# Patient Record
Sex: Female | Born: 1950 | Race: White | Hispanic: No | Marital: Married | Smoking: Never smoker
Health system: Southern US, Community
[De-identification: ages and names within clinical notes are randomized; demographics above are authoritative.]

## PROBLEM LIST (undated history)

## (undated) DIAGNOSIS — I4719 Other supraventricular tachycardia: Secondary | ICD-10-CM

## (undated) DIAGNOSIS — M797 Fibromyalgia: Secondary | ICD-10-CM

## (undated) DIAGNOSIS — I493 Ventricular premature depolarization: Secondary | ICD-10-CM

## (undated) DIAGNOSIS — B54 Unspecified malaria: Secondary | ICD-10-CM

## (undated) DIAGNOSIS — I4510 Unspecified right bundle-branch block: Secondary | ICD-10-CM

## (undated) DIAGNOSIS — I491 Atrial premature depolarization: Secondary | ICD-10-CM

## (undated) DIAGNOSIS — I471 Supraventricular tachycardia: Secondary | ICD-10-CM

## (undated) DIAGNOSIS — I700 Atherosclerosis of aorta: Secondary | ICD-10-CM

## (undated) DIAGNOSIS — T7840XA Allergy, unspecified, initial encounter: Secondary | ICD-10-CM

## (undated) DIAGNOSIS — G4733 Obstructive sleep apnea (adult) (pediatric): Secondary | ICD-10-CM

## (undated) DIAGNOSIS — J449 Chronic obstructive pulmonary disease, unspecified: Secondary | ICD-10-CM

## (undated) DIAGNOSIS — J479 Bronchiectasis, uncomplicated: Secondary | ICD-10-CM

## (undated) DIAGNOSIS — M810 Age-related osteoporosis without current pathological fracture: Secondary | ICD-10-CM

## (undated) DIAGNOSIS — Z9289 Personal history of other medical treatment: Secondary | ICD-10-CM

## (undated) HISTORY — DX: Unspecified right bundle-branch block: I45.10

## (undated) HISTORY — DX: Personal history of other medical treatment: Z92.89

## (undated) HISTORY — DX: Chronic obstructive pulmonary disease, unspecified: J44.9

## (undated) HISTORY — DX: Ventricular premature depolarization: I49.3

## (undated) HISTORY — DX: Bronchiectasis, uncomplicated: J47.9

## (undated) HISTORY — DX: Atrial premature depolarization: I49.1

## (undated) HISTORY — DX: Allergy, unspecified, initial encounter: T78.40XA

## (undated) HISTORY — DX: Supraventricular tachycardia: I47.1

## (undated) HISTORY — PX: APPENDECTOMY: SHX54

## (undated) HISTORY — DX: Other supraventricular tachycardia: I47.19

## (undated) HISTORY — DX: Age-related osteoporosis without current pathological fracture: M81.0

## (undated) HISTORY — DX: Atherosclerosis of aorta: I70.0

## (undated) HISTORY — DX: Unspecified malaria: B54

## (undated) HISTORY — DX: Obstructive sleep apnea (adult) (pediatric): G47.33

## (undated) HISTORY — DX: Fibromyalgia: M79.7

## (undated) HISTORY — PX: TEMPOROMANDIBULAR JOINT SURGERY: SHX35

---

## 1998-10-21 ENCOUNTER — Encounter: Admission: RE | Admit: 1998-10-21 | Discharge: 1998-10-21 | Payer: Self-pay | Admitting: Internal Medicine

## 1998-12-03 ENCOUNTER — Other Ambulatory Visit: Admission: RE | Admit: 1998-12-03 | Discharge: 1998-12-03 | Payer: Self-pay | Admitting: Obstetrics & Gynecology

## 1999-09-05 ENCOUNTER — Emergency Department (HOSPITAL_COMMUNITY): Admission: EM | Admit: 1999-09-05 | Discharge: 1999-09-05 | Payer: Self-pay | Admitting: Emergency Medicine

## 1999-12-09 ENCOUNTER — Other Ambulatory Visit: Admission: RE | Admit: 1999-12-09 | Discharge: 1999-12-09 | Payer: Self-pay | Admitting: Obstetrics & Gynecology

## 2000-12-15 ENCOUNTER — Other Ambulatory Visit: Admission: RE | Admit: 2000-12-15 | Discharge: 2000-12-15 | Payer: Self-pay | Admitting: Obstetrics & Gynecology

## 2001-03-16 ENCOUNTER — Other Ambulatory Visit: Admission: RE | Admit: 2001-03-16 | Discharge: 2001-03-16 | Payer: Self-pay | Admitting: Obstetrics & Gynecology

## 2001-12-21 ENCOUNTER — Other Ambulatory Visit: Admission: RE | Admit: 2001-12-21 | Discharge: 2001-12-21 | Payer: Self-pay | Admitting: Obstetrics & Gynecology

## 2003-01-30 ENCOUNTER — Other Ambulatory Visit: Admission: RE | Admit: 2003-01-30 | Discharge: 2003-01-30 | Payer: Self-pay | Admitting: Obstetrics & Gynecology

## 2004-02-05 ENCOUNTER — Other Ambulatory Visit: Admission: RE | Admit: 2004-02-05 | Discharge: 2004-02-05 | Payer: Self-pay | Admitting: Obstetrics & Gynecology

## 2005-02-06 ENCOUNTER — Other Ambulatory Visit: Admission: RE | Admit: 2005-02-06 | Discharge: 2005-02-06 | Payer: Self-pay | Admitting: Obstetrics & Gynecology

## 2006-02-17 ENCOUNTER — Other Ambulatory Visit: Admission: RE | Admit: 2006-02-17 | Discharge: 2006-02-17 | Payer: Self-pay | Admitting: Obstetrics & Gynecology

## 2006-08-16 ENCOUNTER — Ambulatory Visit: Payer: Self-pay | Admitting: Gastroenterology

## 2009-09-28 ENCOUNTER — Encounter: Admission: RE | Admit: 2009-09-28 | Discharge: 2009-09-28 | Payer: Self-pay | Admitting: Neurosurgery

## 2010-12-21 ENCOUNTER — Encounter: Payer: Self-pay | Admitting: Otolaryngology

## 2013-01-10 ENCOUNTER — Other Ambulatory Visit: Payer: Self-pay | Admitting: Family Medicine

## 2013-01-10 DIAGNOSIS — E049 Nontoxic goiter, unspecified: Secondary | ICD-10-CM

## 2013-01-17 ENCOUNTER — Ambulatory Visit (INDEPENDENT_AMBULATORY_CARE_PROVIDER_SITE_OTHER): Payer: BC Managed Care – PPO | Admitting: Family Medicine

## 2013-01-17 DIAGNOSIS — H6590 Unspecified nonsuppurative otitis media, unspecified ear: Secondary | ICD-10-CM

## 2013-01-17 DIAGNOSIS — R42 Dizziness and giddiness: Secondary | ICD-10-CM

## 2013-01-17 DIAGNOSIS — R002 Palpitations: Secondary | ICD-10-CM

## 2013-01-17 DIAGNOSIS — J0100 Acute maxillary sinusitis, unspecified: Secondary | ICD-10-CM

## 2013-01-17 MED ORDER — AZITHROMYCIN 250 MG PO TABS: ORAL_TABLET | ORAL | Status: DC

## 2013-01-17 MED ORDER — IPRATROPIUM BROMIDE 0.06 % NA SOLN: 2.0000 | Freq: Four times a day (QID) | NASAL | Status: DC

## 2013-01-17 NOTE — Progress Notes (Signed)
Subjective:    Patient ID: Michele Jones, female    DOB: 1951/02/03, 62 y.o.   MRN: [id number]  HPI Michele Jones is a 62 y.o. female  Fatigue over [past week with slight runny nose, slight phlegm in throat.  Last few days - sinus headache, fatigue, dizzy at times with quick movements - has noticed this coinciding with R ear pain/pressure. Low grade ha with pressure under L greater than R eye.  Popping/clicking in ear. Runny nose at times.  Tx: allegra - not sure if has pseudoephedrine in it.  in am. Humidifier, nasal spray from health food store - unknown ingredient.   Zpak in past for sinus if  Review of Systems  Constitutional: Positive for fatigue. Negative for fever and chills.  HENT: Positive for ear pain (clicking/popping. ), congestion and sinus pressure.   Respiratory: Positive for wheezing. Negative for chest tightness and shortness of breath.   Cardiovascular: Positive for palpitations (few extra beats every now and then - past 2 weeks. ). Negative for chest pain.       Objective:   Physical Exam  Vitals reviewed. Constitutional: She is oriented to person, place, and time. She appears well-developed and well-nourished. No distress.  HENT:  Head: Normocephalic and atraumatic.  Right Ear: Hearing, external ear and ear canal normal. A middle ear effusion (clear - yellow fluid behind L tm. no erythema.) is present.  Left Ear: Hearing, tympanic membrane, external ear and ear canal normal.  Nose: No mucosal edema. Right sinus exhibits maxillary sinus tenderness. Left sinus exhibits maxillary sinus tenderness.  Mouth/Throat: Oropharynx is clear and moist. No oropharyngeal exudate.  Eyes: Conjunctivae and EOM are normal. Pupils are equal, round, and reactive to light.  Cardiovascular: Normal rate, regular rhythm, normal heart sounds and intact distal pulses.   No murmur heard. Pulmonary/Chest: Effort normal and breath sounds normal. No respiratory distress. She has no  wheezes. She has no rhonchi.  Neurological: She is alert and oriented to person, place, and time. She has normal strength. No sensory deficit. She displays a negative Romberg sign. Coordination and gait normal.  No nystagmus, nonfocal exam.   Skin: Skin is warm and dry. No rash noted.  Psychiatric: She has a normal mood and affect. Her behavior is normal.       Assessment & Plan:  Michele Jones is a 61 y.o. female Serous otitis media, right with sinusitis/sinus pressure - ddx viral vs early infectious with  Secondary fatigue, HA.. May also be contributing to dizziness.  Plan: azithromycin (ZITHROMAX) 250 MG tablet, ipratropium (ATROVENT) 0.06 % nasal spray.  Fluids, sx care as below.   Sinusitis, acute, maxillary - Plan: azithromycin (ZITHROMAX) 250 MG tablet, saline ns, rtc precautions.  Palpitations - ? Med related. Advised against pseudoephedrine products, and if sx's persist - rtc.  Patient Instructions  Your ear pressure and clicking, and dizziness with head movement are likely from a serous otitis - see below. Your sinus symptoms could be from congestion or early infection.  You can start the prescribed nasal spray, z pak antibiotic, and Saline nasal spray atleast 4 times per day.  drink plenty of fluids.  Continue allegra, but make sure it is not combined with a decongestant (pseudoephedrine) as this may cause heart palpitiations.  If the palpitations are not improving in the next few days, or these or the dizziness worsens - Return to the clinic or go to the nearest emergency room.  Serous Otitis Media  Serous otitis media is also known  as otitis media with effusion (OME). It means there is fluid in the middle ear space. This space contains the bones for hearing and air. Air in the middle ear space helps to transmit sound.  The air gets there through the eustachian tube. This tube goes from the back of the throat to the middle ear space. It keeps the pressure in the middle ear the  same as the outside world. It also helps to drain fluid from the middle ear space. CAUSES  OME occurs when the eustachian tube gets blocked. Blockage can come from:  Ear infections.  Colds and other upper respiratory infections.  Allergies.  Irritants such as cigarette smoke.  Sudden changes in air pressure (such as descending in an airplane).  Enlarged adenoids. During colds and upper respiratory infections, the middle ear space can become temporarily filled with fluid. This can happen after an ear infection also. Once the infection clears, the fluid will generally drain out of the ear through the eustachian tube. If it does not, then OME occurs. SYMPTOMS   Hearing loss.  A feeling of fullness in the ear  but no pain.  Young children may not show any symptoms. DIAGNOSIS   Diagnosis of OME is made by an ear exam.  Tests may be done to check on the movement of the eardrum.  Hearing exams may be done. TREATMENT   The fluid most often goes away without treatment.  If allergy is the cause, allergy treatment may be helpful.  Fluid that persists for several months may require minor surgery. A small tube is placed in the ear drum to:  Drain the fluid.  Restore the air in the middle ear space.  In certain situations, antibiotics are used to avoid surgery.  Surgery may be done to remove enlarged adenoids (if this is the cause). HOME CARE INSTRUCTIONS   Keep children away from tobacco smoke.  Be sure to keep follow up appointments, if any. SEEK MEDICAL CARE IF:   Hearing is not better in 3 months.  Hearing is worse.  Ear pain.  Drainage from the ear.  Dizziness. Document Released: 02/06/2004 Document Revised: 02/08/2012 Document Reviewed: 12/06/2008 [facility] Patient Information 2013 [place], [place].

## 2013-01-17 NOTE — Patient Instructions (Signed)
Your ear pressure and clicking, and dizziness with head movement are likely from a serous otitis - see below. Your sinus symptoms could be from congestion or early infection.  You can start the prescribed nasal spray, z pak antibiotic, and Saline nasal spray atleast 4 times per day.  drink plenty of fluids.  Continue allegra, but make sure it is not combined with a decongestant (pseudoephedrine) as this may cause heart palpitiations.  If the palpitations are not improving in the next few days, or these or the dizziness worsens - Return to the clinic or go to the nearest emergency room.  Serous Otitis Media  Serous otitis media is also known as otitis media with effusion (OME). It means there is fluid in the middle ear space. This space contains the bones for hearing and air. Air in the middle ear space helps to transmit sound.  The air gets there through the eustachian tube. This tube goes from the back of the throat to the middle ear space. It keeps the pressure in the middle ear the same as the outside world. It also helps to drain fluid from the middle ear space. CAUSES  OME occurs when the eustachian tube gets blocked. Blockage can come from:  Ear infections.  Colds and other upper respiratory infections.  Allergies.  Irritants such as cigarette smoke.  Sudden changes in air pressure (such as descending in an airplane).  Enlarged adenoids. During colds and upper respiratory infections, the middle ear space can become temporarily filled with fluid. This can happen after an ear infection also. Once the infection clears, the fluid will generally drain out of the ear through the eustachian tube. If it does not, then OME occurs. SYMPTOMS   Hearing loss.  A feeling of fullness in the ear  but no pain.  Young children may not show any symptoms. DIAGNOSIS   Diagnosis of OME is made by an ear exam.  Tests may be done to check on the movement of the eardrum.  Hearing exams may be  done. TREATMENT   The fluid most often goes away without treatment.  If allergy is the cause, allergy treatment may be helpful.  Fluid that persists for several months may require minor surgery. A small tube is placed in the ear drum to:  Drain the fluid.  Restore the air in the middle ear space.  In certain situations, antibiotics are used to avoid surgery.  Surgery may be done to remove enlarged adenoids (if this is the cause). HOME CARE INSTRUCTIONS   Keep children away from tobacco smoke.  Be sure to keep follow up appointments, if any. SEEK MEDICAL CARE IF:   Hearing is not better in 3 months.  Hearing is worse.  Ear pain.  Drainage from the ear.  Dizziness. Document Released: 02/06/2004 Document Revised: 02/08/2012 Document Reviewed: 12/06/2008 [facility] Patient Information 2013 [place], [place].

## 2013-01-18 ENCOUNTER — Ambulatory Visit
Admission: RE | Admit: 2013-01-18 | Discharge: 2013-01-18 | Disposition: A | Discharge status: Home / Self Care | Payer: BC Managed Care – PPO | Source: Ambulatory Visit | Attending: Family Medicine | Admitting: Family Medicine

## 2013-01-18 DIAGNOSIS — E049 Nontoxic goiter, unspecified: Secondary | ICD-10-CM

## 2013-01-24 ENCOUNTER — Ambulatory Visit (INDEPENDENT_AMBULATORY_CARE_PROVIDER_SITE_OTHER): Payer: BC Managed Care – PPO | Admitting: Family Medicine

## 2013-01-24 VITALS — BP 150/69 | HR 70 | Temp 97.8°F | Resp 16 | Ht 67.0 in | Wt 117.4 lb

## 2013-01-24 DIAGNOSIS — R42 Dizziness and giddiness: Secondary | ICD-10-CM

## 2013-01-24 DIAGNOSIS — H6590 Unspecified nonsuppurative otitis media, unspecified ear: Secondary | ICD-10-CM

## 2013-01-24 DIAGNOSIS — R002 Palpitations: Secondary | ICD-10-CM

## 2013-01-24 DIAGNOSIS — R51 Headache: Secondary | ICD-10-CM

## 2013-01-24 DIAGNOSIS — R5383 Other fatigue: Secondary | ICD-10-CM

## 2013-01-24 DIAGNOSIS — R5381 Other malaise: Secondary | ICD-10-CM

## 2013-01-24 LAB — POCT CBC
Granulocyte percent: 51.3 %G (ref 37–80)
Lymph, poc: 3.4 (ref 0.6–3.4)
MCH, POC: 27.3 pg (ref 27–31.2)
MCHC: 31 g/dL — AB (ref 31.8–35.4)
MCV: 88.1 fL (ref 80–97)
MID (cbc): 0.5 (ref 0–0.9)
POC LYMPH PERCENT: 42.3 %L (ref 10–50)
Platelet Count, POC: 338 10*3/uL (ref 142–424)
RDW, POC: 15.1 %
WBC: 8 10*3/uL (ref 4.6–10.2)

## 2013-01-24 LAB — BASIC METABOLIC PANEL
BUN: 17 mg/dL (ref 6–23)
Creat: 0.83 mg/dL (ref 0.50–1.10)

## 2013-01-24 LAB — TSH: TSH: 1.963 u[IU]/mL (ref 0.350–4.500)

## 2013-01-24 MED ORDER — MECLIZINE HCL 25 MG PO TABS: 25.0000 mg | ORAL_TABLET | Freq: Three times a day (TID) | ORAL | Status: DC | PRN

## 2013-01-24 NOTE — Progress Notes (Signed)
Subjective:    Patient ID: Michele Jones, female    DOB: 1951/02/01, 62 y.o.   MRN: [id number]  HPI Michele Jones is a 62 y.o. female Seen 01/17/13. Treated with azithromycin for possible early maxillary sinusitis vs viral syndrome.  Here for follow up.   Feels a little bit better, but still "dragging".  Slight sinus headache this morning.  Less clicking in ear, but still feels full. No fever. Not worsening, but still fatigued.  Has been using saline NS, and atrovent ns just once per day.  Min to no cough, fatigue, but not short of breath.  Slight dizziness but better than last ov.   Palpitations with decongestant use prior. No chest pains, but still occasional "thump" palpitations, few times per day - possibly few more than last time. No other otc meds. Hx of small goiter with recent thyroid ultrasound  - "stable" - per primary doctor -  Dr. Zachery Dauer.  Review of Systems  Constitutional: Positive for fatigue. Negative for fever and chills.  HENT: Positive for congestion and sinus pressure. Negative for nosebleeds.   Respiratory: Positive for cough. Negative for chest tightness, shortness of breath and wheezing.   Cardiovascular: Positive for palpitations. Negative for chest pain and leg swelling.  Skin: Negative for rash.  Neurological: Positive for headaches.   As above. No chest pain.     Objective:   Physical Exam  Vitals reviewed. Constitutional: She is oriented to person, place, and time. She appears well-developed and well-nourished. No distress.  HENT:  Head: Normocephalic and atraumatic.  Right Ear: Hearing, external ear and ear canal normal. A middle ear effusion is present.  Left Ear: Hearing, external ear and ear canal normal. A middle ear effusion is present.  Nose: Mucosal edema present. No rhinorrhea or nasal septal hematoma. No epistaxis. Right sinus exhibits no maxillary sinus tenderness and no frontal sinus tenderness. Left sinus exhibits maxillary sinus tenderness  (minimal. ) and frontal sinus tenderness.  Mouth/Throat: Oropharynx is clear and moist. No oropharyngeal exudate.  Clear fluid behind bilaterral TM's.   Eyes: Conjunctivae and EOM are normal. Pupils are equal, round, and reactive to light. Right eye exhibits no nystagmus. Left eye exhibits no nystagmus.  Cardiovascular: Normal rate, regular rhythm, normal heart sounds and intact distal pulses.   No extrasystoles are present.  No murmur heard. Pulmonary/Chest: Effort normal and breath sounds normal. No respiratory distress. She has no wheezes. She has no rhonchi.  Neurological: She is alert and oriented to person, place, and time. She has normal strength. No sensory deficit. She displays a negative Romberg sign. Coordination and gait normal.  No pronator drift., nonfocal.   Skin: Skin is warm and dry. No rash noted.  Psychiatric: She has a normal mood and affect. Her behavior is normal.   EKG: nonspecific twaves V1, but sinus rhythm without ectopy, rate 69, PR 152, QTc 419 Results for orders placed in visit on 01/24/13  POCT CBC      Result Value Range   WBC 8.0  4.6 - 10.2 K/uL   Lymph, poc 3.4  0.6 - 3.4   POC LYMPH PERCENT 42.3  10 - 50 %L   MID (cbc) 0.5  0 - 0.9   POC MID % 6.4  0 - 12 %M   POC Granulocyte 4.1  2 - 6.9   Granulocyte percent 51.3  37 - 80 %G   RBC 4.58  4.04 - 5.48 M/uL   Hemoglobin 12.5  12.2 - 16.2 g/dL  HCT, POC 40.3  37.7 - 47.9 %   MCV 88.1  80 - 97 fL   MCH, POC 27.3  27 - 31.2 pg   MCHC 31.0 (*) 31.8 - 35.4 g/dL   RDW, POC 87.5     Platelet Count, POC 338  142 - 424 K/uL   MPV 8.3  0 - 99.8 fL        Assessment & Plan:  Michele Jones is a 62 y.o. female  Suspected viral syndrome with secondary serous otitis, and dizziness as last ov, possible sinusitis with typical fronto-maxillary ha. some improvement on azithro, and other abx allergies noted.  nonfocal neuro exam - still suspect mild dizziness with serous otitis.  Increase use of saline NS and  atrovent NS up to 4 times per day.  Will check BMP, sx care discussed.   Fatigue - Plan: POCT CBC, Basic metabolic panel, TSH, EKG 12-Lead  Palpitations - Plan: TSH, EKG 12-Lead. and BMP, but can follow up with primary provder to decide of cards eval needed.  Suspected PVC's, so advised to continue to avoid decongestants and caffeine.   Serous otitis media, - both sides appear to have effusion, see above. Can trr meclizine for dizziness. Rtc/er/911 precautions discussed, but nonfocal neuro exam at present.   Dizziness - likley d/t ETD/serous otitis.  Not using decongestants with palpitations.   Patient Instructions  Continue saline nasal spray, atrovent nasal spray up to 4 times per day. Rest and fluids as discussed. Meclizine if needed for dizziness, but Return to the clinic or go to the nearest emergency room if any of your symptoms worsen or new symptoms occur. Call your primary doctor to follow up on palpitations as you have had these before, but if any increase or worsening - be evaluated here or in emergency room.   Recheck in next week if not improving.    Meds ordered this encounter  Medications  . meclizine (ANTIVERT) 25 MG tablet    Sig: Take 1 tablet (25 mg total) by mouth 3 (three) times daily as needed.    Dispense:  30 tablet    Refill:  0

## 2013-01-24 NOTE — Patient Instructions (Signed)
Continue saline nasal spray, atrovent nasal spray up to 4 times per day. Rest and fluids as discussed. Meclizine if needed for dizziness, but Return to the clinic or go to the nearest emergency room if any of your symptoms worsen or new symptoms occur. Call your primary doctor to follow up on palpitations as you have had these before, but if any increase or worsening - be evaluated here or in emergency room.   Recheck in next week if not improving.

## 2013-05-08 ENCOUNTER — Other Ambulatory Visit: Payer: Self-pay | Admitting: Family Medicine

## 2013-05-08 DIAGNOSIS — R109 Unspecified abdominal pain: Secondary | ICD-10-CM

## 2013-05-15 ENCOUNTER — Ambulatory Visit
Admission: RE | Admit: 2013-05-15 | Discharge: 2013-05-15 | Disposition: A | Discharge status: Home / Self Care | Payer: BC Managed Care – PPO | Source: Ambulatory Visit | Attending: Family Medicine | Admitting: Family Medicine

## 2013-05-15 DIAGNOSIS — R109 Unspecified abdominal pain: Secondary | ICD-10-CM

## 2013-06-16 ENCOUNTER — Other Ambulatory Visit: Payer: Self-pay | Admitting: Family Medicine

## 2013-07-24 ENCOUNTER — Ambulatory Visit (INDEPENDENT_AMBULATORY_CARE_PROVIDER_SITE_OTHER): Payer: BC Managed Care – PPO | Admitting: Family Medicine

## 2013-07-24 VITALS — BP 134/72 | HR 69 | Temp 98.1°F | Resp 16 | Ht 67.0 in | Wt 118.0 lb

## 2013-07-24 DIAGNOSIS — H6590 Unspecified nonsuppurative otitis media, unspecified ear: Secondary | ICD-10-CM

## 2013-07-24 DIAGNOSIS — R0981 Nasal congestion: Secondary | ICD-10-CM

## 2013-07-24 DIAGNOSIS — J0100 Acute maxillary sinusitis, unspecified: Secondary | ICD-10-CM

## 2013-07-24 DIAGNOSIS — J3489 Other specified disorders of nose and nasal sinuses: Secondary | ICD-10-CM

## 2013-07-24 MED ORDER — IPRATROPIUM BROMIDE 0.06 % NA SOLN: NASAL | Status: DC

## 2013-07-24 MED ORDER — AZITHROMYCIN 250 MG PO TABS: ORAL_TABLET | ORAL | Status: DC

## 2013-07-24 NOTE — Patient Instructions (Addendum)
Please let me know if you are not feeling better in the next few days- Sooner if worse.

## 2013-07-24 NOTE — Progress Notes (Signed)
[facility] [address] [place] [address] [phone]  Date:  07/24/2013   Name:  Michele Jones   DOB:  Apr 20, 1951   MRN:  [id number]  PCP:  Gaye Alken, MD    Chief Complaint: Facial Pain, Nausea and Sinus Problem   History of Present Illness:  Michele Jones is a 62 y.o. very pleasant female patient who presents with the following:  She is here today with illness for about 8 days. She is "prone to sinus infections" and does see an allergiest.  She is concerned that she may have a sinus infection again.  Her current sx seem like a typical sinus infection to her.  She notes pain and congestion/ sinus pressure.   She also notes some ear pain.  Her nose is running a lot.   She does not have a producive cough, but does have some PND. She has not noted a fever.  She has had some chills and aches.  No vomiting, but she has felt nauseated.    She is otherwise generally healthy.   She is using atrovent nasal which has helped some- needs a refill.  She usually uses azithromycin for sinusitis as she is allergic to several other abx   There are no active problems to display for this patient.   Past Medical History  Diagnosis Date  . Allergy   . Osteoporosis     Past Surgical History  Procedure Laterality Date  . Appendectomy    . Temporomandibular joint surgery      History  Substance Use Topics  . Smoking status: Never Smoker   . Smokeless tobacco: Not on file  . Alcohol Use: No    Family History  Problem Relation Age of Onset  . Stroke Mother   . Cancer Father   . Hypertension Brother   . Cancer Maternal Grandmother   . Diabetes Paternal Grandmother     Allergies  Allergen Reactions  . Amoxicillin Rash    Thinks it may cause a rash.   Doylene Bode [Sulfamethoxazole-Tmp Ds] Rash    Thinks it may cause a rash.   . Augmentin [Amoxicillin-Pot Clavulanate] Rash  . Levaquin [Levofloxacin In D5w] Rash    Medication list  has been reviewed and updated.  Current Outpatient Prescriptions on File Prior to Visit  Medication Sig Dispense Refill  . Ascorbic Acid (VITAMIN C) 100 MG tablet Take 100 mg by mouth daily.      . calcium carbonate (OS-CAL - DOSED IN MG OF ELEMENTAL CALCIUM) 1250 MG tablet Take 1 tablet by mouth daily.      . cholecalciferol (VITAMIN D) 1000 UNITS tablet Take 1,000 Units by mouth daily.      . Cranberry 200 MG CAPS Take by mouth.      . fexofenadine-pseudoephedrine (ALLEGRA-D 24) 180-240 MG per 24 hr tablet Take 1 tablet by mouth daily.      [name] ipratropium (ATROVENT) 0.06 % nasal spray PLACE 2 SPRAYS INTO THE NOSE 4 (FOUR) TIMES DAILY.  15 mL  0  . Multiple Vitamins-Minerals (MULTIVITAMIN WITH MINERALS) tablet Take 1 tablet by mouth daily.      [name] azithromycin (ZITHROMAX) 250 MG tablet Take 2 pills by mouth on day 1, then 1 pill by mouth per day on days 2 through 5.  6 each  0  . meclizine (ANTIVERT) 25 MG tablet Take 1 tablet (25 mg total) by mouth 3 (three) times daily as needed.  30 tablet  0  No current facility-administered medications on file prior to visit.    Review of Systems:  As per HPI- otherwise negative.   Physical Examination: Filed Vitals:   07/24/13 1244  BP: 134/72  Pulse: 69  Temp: 98.1 F (36.7 C)  Resp: 16   Filed Vitals:   07/24/13 1244  Height: 5\' 7"  (1.702 m)  Weight: 118 lb (53.524 kg)   Body mass index is 18.48 kg/(m^2). Ideal Body Weight: Weight in (lb) to have BMI = 25: 159.3  GEN: WDWN, NAD, Non-toxic, A & O x 3, slim build HEENT: Atraumatic, Normocephalic. Neck supple. No masses, No LAD.  Bilateral TM wnl, oropharynx normal.  PEERL,EOMI.   Ears and Nose: No external deformity. CV: RRR, No M/G/R. No JVD. No thrill. No extra heart sounds. PULM: CTA B, no wheezes, crackles, rhonchi. No retractions. No resp. distress. No accessory muscle use. ABD: S, NT, ND EXTR: No c/c/e NEURO Normal gait. Normal strength, sensation and DTR all extremities   PSYCH: Normally interactive. Conversant. Not depressed or anxious appearing.  Calm demeanor.    Assessment and Plan: Sinusitis, acute, maxillary - Plan: azithromycin (ZITHROMAX) 250 MG tablet  Serous otitis media, right - Plan: azithromycin (ZITHROMAX) 250 MG tablet  Nasal congestion - Plan: ipratropium (ATROVENT) 0.06 % nasal spray    Signed Abbe Amsterdam, MD

## 2013-08-23 ENCOUNTER — Other Ambulatory Visit: Payer: Self-pay | Admitting: Physician Assistant

## 2013-11-27 ENCOUNTER — Encounter: Payer: Self-pay | Admitting: General Surgery

## 2013-11-27 DIAGNOSIS — R002 Palpitations: Secondary | ICD-10-CM

## 2013-12-08 ENCOUNTER — Encounter (INDEPENDENT_AMBULATORY_CARE_PROVIDER_SITE_OTHER): Payer: Self-pay

## 2013-12-08 ENCOUNTER — Encounter: Payer: Self-pay | Admitting: General Surgery

## 2013-12-08 ENCOUNTER — Ambulatory Visit (INDEPENDENT_AMBULATORY_CARE_PROVIDER_SITE_OTHER): Payer: BC Managed Care – PPO | Admitting: Cardiology

## 2013-12-08 ENCOUNTER — Encounter: Payer: Self-pay | Admitting: Cardiology

## 2013-12-08 VITALS — BP 120/80 | HR 80 | Ht 67.5 in | Wt 116.0 lb

## 2013-12-08 DIAGNOSIS — R0602 Shortness of breath: Secondary | ICD-10-CM

## 2013-12-08 DIAGNOSIS — G4710 Hypersomnia, unspecified: Secondary | ICD-10-CM

## 2013-12-08 DIAGNOSIS — R002 Palpitations: Secondary | ICD-10-CM

## 2013-12-08 DIAGNOSIS — R0683 Snoring: Secondary | ICD-10-CM

## 2013-12-08 DIAGNOSIS — R079 Chest pain, unspecified: Secondary | ICD-10-CM

## 2013-12-08 DIAGNOSIS — R0989 Other specified symptoms and signs involving the circulatory and respiratory systems: Secondary | ICD-10-CM

## 2013-12-08 DIAGNOSIS — R0609 Other forms of dyspnea: Secondary | ICD-10-CM

## 2013-12-08 NOTE — Progress Notes (Signed)
[place], [place] [place], [place]  [address] Phone: [phone] Fax:  [phone]  Date:  12/08/2013   ID:  Michele Jones, DOB [birth year], MRN [id number]  PCP:  Gerrit Heck, MD  Cardiologist:  Fransico Him, MD     History of Present Illness: Michele Jones is a 63 y.o. female with no prior cardiac history who presents today for evaluation of palpitations.  She has had palpitations intermittently for years but over the past 2 months they have worsened in frequency.  They now occur almost daily and feels as though her heart is skipping and sometimes that it is racing for a few seconds up to a few minutes.  She was under a lot of stress with the holidays.  She denies any use of cold decongestants and uses minimal caffeine.  Recent blood work by PCP including TSH and CBC were normal.  EKG showed an isolated PVC.  She now presents for evaluation.  Since the holidays have gone her palpitations have significantly improved.  She says that she had some pain over her left breast and into her left axilla and occurs when she gets the palpitations but occasionally without the palpitations and she describes it as a pain and sometimes a pressure.  She also has had some SOB that can occur without the palpitations.   She also has very frequent awakening at night with feeling poorly when she wakes up at night.  She has been told that she has severe snoring.  SHe has to take sleeping pills to sleep through the night.  She feels tired throughout the day. She has been told by her daughter that she has very severe snoring.    Wt Readings from Last 3 Encounters:  12/08/13 116 lb (52.617 kg)  11/27/13 115 lb 6.4 oz (52.345 kg)  07/24/13 118 lb (53.524 kg)     Past Medical History  Diagnosis Date  . Allergy   . Osteoporosis   . Fibromyalgia   . Malaria     Current Outpatient Prescriptions  Medication Sig Dispense Refill  . Ascorbic Acid (VITAMIN C) 100 MG tablet Take 100 mg by  mouth daily.      . Boron 3 MG CAPS Take by mouth daily.      . cholecalciferol (VITAMIN D) 1000 UNITS tablet Take 1,000 Units by mouth daily.      . Cranberry 200 MG CAPS Take by mouth.      . Digestive Enzymes (SIMILASE PO) Take by mouth daily.      . fexofenadine-pseudoephedrine (ALLEGRA-D 24) 180-240 MG per 24 hr tablet Take 1 tablet by mouth daily.      [name] glucosamine-chondroitin 500-400 MG tablet Take 1 tablet by mouth 3 (three) times daily.      . Grape Seed 100 MG CAPS Take by mouth 2 (two) times daily.      . magnesium gluconate (MAGONATE) 500 MG tablet Take 500 mg by mouth daily.      . Multiple Vitamins-Minerals (MULTIVITAMIN WITH MINERALS) tablet Take 1 tablet by mouth daily.      [name] Leaf Extract 150 MG CAPS Take by mouth daily.      [name] OVER THE COUNTER MEDICATION wobenzym N one tab qd      . OVER THE COUNTER MEDICATION nopalea liquid one tablespoon daily      . Quercetin (QUERCITIN) POWD by Does not apply route daily.      [name] thiamine (VITAMIN B-1) 100 MG tablet Take 100  mg by mouth daily.      [name] zolpidem (AMBIEN) 10 MG tablet Take 10 mg by mouth at bedtime as needed for sleep.       No current facility-administered medications for this visit.    Allergies:    Allergies  Allergen Reactions  . Amoxicillin Rash    Thinks it may cause a rash.   Sarina Ill [Sulfamethoxazole-Tmp Ds] Rash    Thinks it may cause a rash.   . Doxycycline Nausea Only    Stomach upset  . Augmentin [Amoxicillin-Pot Clavulanate] Rash  . Levaquin [Levofloxacin In D5w] Rash    Social History:  The patient  reports that she has never smoked. She does not have any smokeless tobacco history on file. She reports that she does not drink alcohol or use illicit drugs.   Family History:  The patient's family history includes Cancer in her father and maternal grandmother; Diabetes in her paternal grandmother; Hypertension in her brother; Stroke in her mother.   ROS:  Please see the history of present  illness.      All other systems reviewed and negative.   PHYSICAL EXAM: VS:  BP 120/80  Pulse 80  Ht 5' 7.5" (1.715 m)  Wt 116 lb (52.617 kg)  BMI 17.89 kg/m2 Well nourished, well developed, in no acute distress HEENT: normal Neck: no JVD Cardiac:  normal S1, S2; RRR; no murmur Lungs:  clear to auscultation bilaterally, no wheezing, rhonchi or rales Abd: soft, nontender, no hepatomegaly Ext: no edema Skin: warm and dry Neuro:  CNs 2-12 intact, no focal abnormalities noted  EKG:  NSR with PVC's - done by PVP on 12/29     ASSESSMENT AND PLAN:  1. Palpitations with isolated PVC on EKG  - Event monitor to assess for other arrhythmias besides PVC's and determine amount of PVC's she is having 2.  Chest pain that occurs primarily with the palpitations but can occur by itself  - Stress CL to rule out ischemia 3.  SOB when going up stairs  - 2D echo to assess LVF 4.  Frequent awakening at night with feeling poorly with severe snoring.  SHe has to take sleeping pills to sleep through the night.  She feels tired throughout the day.  I will get a sleep study to rule out sleep apnea.  She has been told by her daughter that she has very severe snoring.  Followup PRN  Signed, Fransico Him, MD 12/08/2013 11:08 AM

## 2013-12-08 NOTE — Patient Instructions (Signed)
Your physician recommends that you continue on your current medications as directed. Please refer to the Current Medication list given to you today.  Your physician has requested that you have an echocardiogram. Echocardiography is a painless test that uses sound waves to create images of your heart. It provides your doctor with information about the size and shape of your heart and how well your heart's chambers and valves are working. This procedure takes approximately one hour. There are no restrictions for this procedure.  Your physician has requested that you have an exercise stress myoview. For further information please visit [email or web address]. Please follow instruction sheet, as given.  Your physician has recommended that you wear an event monitor. Event monitors are medical devices that record the heart's electrical activity. Doctors most often [place] these monitors to diagnose arrhythmias. Arrhythmias are problems with the speed or rhythm of the heartbeat. The monitor is a small, portable device. You can wear one while you do your normal daily activities. This is usually used to diagnose what is causing palpitations/syncope (passing out).  Your physician has recommended that you have a sleep study. This test records several body functions during sleep, including: brain activity, eye movement, oxygen and carbon dioxide blood levels, heart rate and rhythm, breathing rate and rhythm, the flow of air through your mouth and nose, snoring, body muscle movements, and chest and belly movement.(Schedule at [place])  Your physician recommends that you schedule a follow-up appointment: As Needed

## 2013-12-14 ENCOUNTER — Encounter (INDEPENDENT_AMBULATORY_CARE_PROVIDER_SITE_OTHER): Payer: BC Managed Care – PPO

## 2013-12-14 ENCOUNTER — Encounter: Payer: Self-pay | Admitting: *Deleted

## 2013-12-14 DIAGNOSIS — R002 Palpitations: Secondary | ICD-10-CM

## 2013-12-14 NOTE — Progress Notes (Signed)
Patient ID: Michele Jones, female   DOB: 1951/08/28, 63 y.o.   MRN: [id number] [employer] 30 day cardiac event monitor applied to patient.

## 2013-12-15 ENCOUNTER — Telehealth: Payer: Self-pay | Admitting: Cardiology

## 2013-12-15 NOTE — Telephone Encounter (Signed)
Following up     Pt whats some information about the sleep apnia appointment?   Pleas give her a call.

## 2013-12-15 NOTE — Telephone Encounter (Signed)
Spoke with sleep lab and information received stating they did try to call patient. Number of sleep lab given to patient to call them.

## 2013-12-19 ENCOUNTER — Encounter: Payer: Self-pay | Admitting: Cardiovascular Disease

## 2013-12-19 ENCOUNTER — Ambulatory Visit (HOSPITAL_COMMUNITY): Payer: BC Managed Care – PPO | Attending: Cardiovascular Disease | Admitting: Radiology

## 2013-12-19 VITALS — BP 152/82 | HR 83 | Ht 67.0 in | Wt 115.0 lb

## 2013-12-19 DIAGNOSIS — R002 Palpitations: Secondary | ICD-10-CM | POA: Insufficient documentation

## 2013-12-19 DIAGNOSIS — R0602 Shortness of breath: Secondary | ICD-10-CM

## 2013-12-19 DIAGNOSIS — R0609 Other forms of dyspnea: Secondary | ICD-10-CM | POA: Insufficient documentation

## 2013-12-19 DIAGNOSIS — R42 Dizziness and giddiness: Secondary | ICD-10-CM | POA: Insufficient documentation

## 2013-12-19 DIAGNOSIS — R079 Chest pain, unspecified: Secondary | ICD-10-CM

## 2013-12-19 DIAGNOSIS — R0789 Other chest pain: Secondary | ICD-10-CM | POA: Insufficient documentation

## 2013-12-19 DIAGNOSIS — R0989 Other specified symptoms and signs involving the circulatory and respiratory systems: Secondary | ICD-10-CM | POA: Insufficient documentation

## 2013-12-19 MED ORDER — TECHNETIUM TC 99M SESTAMIBI GENERIC - CARDIOLITE
33.0000 | Freq: Once | INTRAVENOUS | Status: AC | PRN
  Administered 2013-12-19: 33 via INTRAVENOUS

## 2013-12-19 MED ORDER — TECHNETIUM TC 99M SESTAMIBI GENERIC - CARDIOLITE
10.8000 | Freq: Once | INTRAVENOUS | Status: AC | PRN
  Administered 2013-12-19: 11 via INTRAVENOUS

## 2013-12-19 NOTE — Progress Notes (Signed)
  [address] [place], [place] [address] [phone]    Cardiology Nuclear Med Study  Salia Cangemi is a 62 y.o. female     MRN : [id number]     DOB: 1950/12/02  Procedure Date: 12/19/2013  Nuclear Med Background Indication for Stress Test:  Evaluation for Ischemia History:  No known CAD, ? MPI ~10 years ago (normal per pt.) Cardiac Risk Factors: none  Symptoms:  Chest Pain (last date of chest discomfort was 3-4 days ago), Dizziness, DOE and Palpitations   Nuclear Pre-Procedure Caffeine/Decaff Intake:  8:30pm NPO After: 8:30pm   Lungs:  clear O2 Sat: 99% on room air. IV 0.9% NS with Angio Cath:  22g  IV Site: L Hand  IV Started by:  Matilde Haymaker, RN  Chest Size (in):  34 Cup Size: A  Height: 5\' 7"  (1.702 m)  Weight:  115 lb (52.164 kg)  BMI:  Body mass index is 18.01 kg/(m^2). Tech Comments:  n/a    Nuclear Med Study 1 or 2 day study: 1 day  Stress Test Type:  Stress  Reading MD: n/a  Order Authorizing Provider:  Tressia Miners Turner,MD  Resting Radionuclide: Technetium 68m Sestamibi  Resting Radionuclide Dose: 11.0 mCi   Stress Radionuclide:  Technetium 80m Sestamibi  Stress Radionuclide Dose: 33.0 mCi           Stress Protocol Rest HR: 83 Stress HR: 141  Rest BP: 152/82 Stress BP: 190/94  Exercise Time (min): 7:00 METS: 8.5           Dose of Adenosine (mg):  n/a Dose of Lexiscan: n/a mg  Dose of Atropine (mg): n/a Dose of Dobutamine: n/a mcg/kg/min (at max HR)  Stress Test Technologist: Glade Lloyd, BS-ES  Nuclear Technologist:  Charlton Amor, CNMT     Rest Procedure:  Myocardial perfusion imaging was performed at rest 45 minutes following the intravenous administration of Technetium 76m Sestamibi. Rest ECG: NSR, 1 mm STD in the inferolateral leads  Stress Procedure:  The patient exercised on the treadmill utilizing the Bruce Protocol for 7:00 minutes. The patient stopped due to SOB and denied any chest pain.   Technetium 8m Sestamibi was injected at peak exercise and myocardial perfusion imaging was performed after a brief delay. Stress ECG: 0.5 mm worsening of STD at the peak exercise that resolves 5 minutes into recovery.  QPS Raw Data Images:  Normal; no motion artifact; normal heart/lung ratio. Stress Images:  Normal homogeneous uptake in all areas of the myocardium. Rest Images:  Normal homogeneous uptake in all areas of the myocardium. Subtraction (SDS):  No evidence of ischemia. Transient Ischemic Dilatation (Normal <1.22):  1.02 Lung/Heart Ratio (Normal <0.45):  0.25  Quantitative Gated Spect Images QGS EDV:  NA QGS ESV:  NA  Impression Exercise Capacity:  Good exercise capacity. BP Response:  Normal blood pressure response. Clinical Symptoms:  There is dyspnea. ECG Impression:  Low specificity ST segment depression with ECG abnormal at baseline. Comparison with Prior Nuclear Study: No previous nuclear study performed  Overall Impression:  Low risk stress nuclear study with equivocal ECG changes and no evidence of ischemia on nuclear imaging..  LV Ejection Fraction: NA.  LV Wall Motion:NA  Ena Dawley, Lemmie Evens 12/19/2013

## 2013-12-20 ENCOUNTER — Telehealth: Payer: Self-pay | Admitting: Cardiology

## 2013-12-20 NOTE — Telephone Encounter (Signed)
Follow up     bp reading--154/92 and 159/109 and 178-118.  Talked to Andee Poles today and wanted to give you these recent bp readings

## 2013-12-20 NOTE — Telephone Encounter (Signed)
Pts stress test was normal.

## 2013-12-20 NOTE — Telephone Encounter (Signed)
Made pt aware. She will call PCP today to make her aware.

## 2013-12-20 NOTE — Telephone Encounter (Signed)
New message  Patient is very concerned her BP is high, and her head is hurting really bad. She feels rotten, please call and advise.

## 2013-12-20 NOTE — Telephone Encounter (Signed)
Please have patient see her PCP today for evaluation

## 2013-12-20 NOTE — Telephone Encounter (Signed)
LVM for pt to return call

## 2013-12-20 NOTE — Telephone Encounter (Signed)
Called pt to get alittle more information Pt had Stress test yesterday. She feels awful today. "she feels very ill or crummy" She also has a very bad headache. This one is lingering. She has taken medication for the headache and it is not working for her. Pt complains that her BP was high at her stress test. She stated it was high according to the tech. Pt did not know the exact number. Pt does not have a BP cuff and is not able to check her BP readings. Pt is concerned about stress test results.

## 2013-12-25 ENCOUNTER — Encounter (HOSPITAL_COMMUNITY): Payer: BC Managed Care – PPO

## 2013-12-26 ENCOUNTER — Telehealth: Payer: Self-pay | Admitting: Cardiology

## 2013-12-26 NOTE — Telephone Encounter (Signed)
Pease let patient know that she has mild OSA overall and severe OSA during dream sleep.  Set up for CPAP titration

## 2013-12-27 NOTE — Telephone Encounter (Signed)
Form for titration filled out for pt.

## 2013-12-27 NOTE — Telephone Encounter (Signed)
Pt is aware.  

## 2013-12-29 ENCOUNTER — Other Ambulatory Visit (HOSPITAL_COMMUNITY): Payer: BC Managed Care – PPO

## 2013-12-29 NOTE — Telephone Encounter (Signed)
Pt is aware.  

## 2013-12-29 NOTE — Telephone Encounter (Signed)
New problem   Pt need to speak to nurse concern needing some extra electrolyte. Please call pt.

## 2014-01-03 ENCOUNTER — Ambulatory Visit (HOSPITAL_COMMUNITY): Payer: BC Managed Care – PPO | Attending: Cardiology | Admitting: Cardiology

## 2014-01-03 ENCOUNTER — Encounter: Payer: Self-pay | Admitting: Cardiology

## 2014-01-03 DIAGNOSIS — R0602 Shortness of breath: Secondary | ICD-10-CM

## 2014-01-03 DIAGNOSIS — R079 Chest pain, unspecified: Secondary | ICD-10-CM

## 2014-01-03 DIAGNOSIS — R0609 Other forms of dyspnea: Secondary | ICD-10-CM | POA: Insufficient documentation

## 2014-01-03 DIAGNOSIS — R0989 Other specified symptoms and signs involving the circulatory and respiratory systems: Secondary | ICD-10-CM | POA: Insufficient documentation

## 2014-01-03 DIAGNOSIS — R002 Palpitations: Secondary | ICD-10-CM | POA: Insufficient documentation

## 2014-01-03 DIAGNOSIS — I079 Rheumatic tricuspid valve disease, unspecified: Secondary | ICD-10-CM | POA: Insufficient documentation

## 2014-01-03 NOTE — Progress Notes (Signed)
Echo performed. 

## 2014-01-15 ENCOUNTER — Telehealth: Payer: Self-pay | Admitting: Cardiology

## 2014-01-15 NOTE — Telephone Encounter (Signed)
PT AWARE MONITOR  AWAITING   DR TURNER'S REVIEW   WILL CALL ONCE READ  .Michele Jones

## 2014-01-15 NOTE — Telephone Encounter (Signed)
New message    Want  Monitor results

## 2014-01-15 NOTE — Telephone Encounter (Signed)
Please let patient know that she had successful CPAP titration to 6cm H2O and set up OV with me in 10 weeks

## 2014-01-16 ENCOUNTER — Telehealth: Payer: Self-pay | Admitting: Cardiology

## 2014-01-16 NOTE — Telephone Encounter (Signed)
Please let patient know that heart monitor showed NSR with occasional extra heart beats from the top and bottom of the heart which are benign. Please find out if she is still having palpitations

## 2014-01-17 ENCOUNTER — Other Ambulatory Visit: Payer: Self-pay | Admitting: General Surgery

## 2014-01-17 MED ORDER — METOPROLOL SUCCINATE ER 25 MG PO TB24: 25.0000 mg | ORAL_TABLET | Freq: Every day | ORAL | Status: DC

## 2014-01-17 NOTE — Telephone Encounter (Signed)
Since she is still having palpitations have her start Toprol XL 25mg  daily and followup with Richardson Dopp in 1 week

## 2014-01-17 NOTE — Telephone Encounter (Signed)
Pt is aware. And is set up for May 8th.

## 2014-01-17 NOTE — Telephone Encounter (Signed)
Pt is aware and set up to see Richardson Dopp in one week. Pt wanted to do research on Toprol tonight and will pick up in the AM. She is apprehensive to start any medications. She will do research and if she doesn't feel comfortable taking the medication she will call me.

## 2014-01-17 NOTE — Telephone Encounter (Signed)
Pt is aware. She is still having palpitations.

## 2014-01-25 ENCOUNTER — Ambulatory Visit: Payer: BC Managed Care – PPO | Admitting: Physician Assistant

## 2014-01-29 ENCOUNTER — Ambulatory Visit: Payer: BC Managed Care – PPO | Admitting: Physician Assistant

## 2014-02-01 ENCOUNTER — Telehealth: Payer: Self-pay | Admitting: Cardiology

## 2014-02-01 NOTE — Telephone Encounter (Signed)
Called [facility] and they will send over what ever they have on pts modem so we can see what it looks like

## 2014-02-01 NOTE — Telephone Encounter (Signed)
New Problem:  [facility] told pt that they believe the pressure on her c-pap is too high.. Pt states she gets really bad headaches and sore "wind pipe".... Pt is requesting a call back from [facility] asap.

## 2014-02-01 NOTE — Telephone Encounter (Signed)
Pt is aware we are waiting for report to be able to know if pt needs the pressure lowered.

## 2014-02-05 ENCOUNTER — Encounter: Payer: Self-pay | Admitting: Cardiology

## 2014-02-06 NOTE — Telephone Encounter (Signed)
Pt called states she spoke with [facility] yesterday and they  Have sent information needed for C-pap orders . According to pt she is not using the C-pap during the night now because [facility] is waiting for orders.

## 2014-02-06 NOTE — Telephone Encounter (Signed)
F/U   Patient calling back to speak with Danielle . Aware she if off today . Message will be route triage nurse .    Checking to see if she receive reading from [facility].  CPAP machine.

## 2014-02-06 NOTE — Telephone Encounter (Signed)
Please order a 2 week autotitration from 4 to 20cm H2O 

## 2014-02-07 ENCOUNTER — Other Ambulatory Visit: Payer: Self-pay | Admitting: General Surgery

## 2014-02-07 DIAGNOSIS — G4733 Obstructive sleep apnea (adult) (pediatric): Secondary | ICD-10-CM

## 2014-02-07 NOTE — Telephone Encounter (Signed)
Ordered and made [facility] aware. Pt is aware as well.

## 2014-02-22 ENCOUNTER — Encounter: Payer: Self-pay | Admitting: Cardiology

## 2014-03-01 ENCOUNTER — Telehealth: Payer: Self-pay | Admitting: Cardiology

## 2014-03-01 DIAGNOSIS — G4733 Obstructive sleep apnea (adult) (pediatric): Secondary | ICD-10-CM

## 2014-03-01 NOTE — Telephone Encounter (Signed)
Order sent.

## 2014-03-01 NOTE — Telephone Encounter (Signed)
Message copied by Alcario Drought on Thu Mar 01, 2014  1:18 PM ------      Message from: Fransico Him R      Created: Thu Mar 01, 2014 12:00 AM       Set CPAP at 10cm H2O and get a download in 2 weeks ------

## 2014-03-14 ENCOUNTER — Encounter: Payer: Self-pay | Admitting: Cardiology

## 2014-03-16 ENCOUNTER — Encounter: Payer: Self-pay | Admitting: General Surgery

## 2014-03-29 ENCOUNTER — Encounter: Payer: Self-pay | Admitting: Cardiology

## 2014-03-31 ENCOUNTER — Encounter: Payer: Self-pay | Admitting: Cardiology

## 2014-04-05 ENCOUNTER — Telehealth: Payer: Self-pay | Admitting: Cardiology

## 2014-04-05 NOTE — Telephone Encounter (Signed)
New Message  Pt called request a call back to discuss her pilow mask and constant palpitations. Please call//SR

## 2014-04-06 ENCOUNTER — Other Ambulatory Visit: Payer: Self-pay | Admitting: General Surgery

## 2014-04-06 ENCOUNTER — Ambulatory Visit: Payer: BC Managed Care – PPO | Admitting: Cardiology

## 2014-04-06 DIAGNOSIS — G4733 Obstructive sleep apnea (adult) (pediatric): Secondary | ICD-10-CM

## 2014-04-06 NOTE — Telephone Encounter (Signed)
Pt does not want to see anyone but you. She has an appt with you in two weeks. Also pt has a download in the media file from [facility]. I will ask them to check for leaks too

## 2014-04-06 NOTE — Telephone Encounter (Signed)
Called pt back. Pt stated she has been having a lot of difficulties with her nasal mask Res med airscent 10. It shows that there is leakage in the morning every morning. Pt is concerned. She has noticed some issues with sleeping. Even with her sleeping pills. Pt also stated that she is having issues with Palpitations. Pt was prescribed metoprolol succ but pt has not taken the medications because she thought her stress was causing the palpitations. Should she start taking the medication now since palpitations have gotten worse. Pt does not want to start and then see a PA or Nurse. She only wants to see Dr Radford Pax after starting the medication.   To Dr Radford Pax to advise.

## 2014-04-06 NOTE — Telephone Encounter (Signed)
Please have her start Toprol for symptomatic PAC's and PVC's.  I would like her to see the PA in 1 week to make sure she is tolerating the Toprol.  I need to see 2 week autotitration download from DME first to determine how much of a leak she has. Please have DME check her mask for fit and leak

## 2014-04-06 NOTE — Telephone Encounter (Signed)
There are no PA appointments available until June. Is it ok to be Nurse visit? For BP check?

## 2014-04-06 NOTE — Telephone Encounter (Signed)
She needs to come in for BP and HR check with nurse next week with new med change

## 2014-04-09 NOTE — Telephone Encounter (Signed)
LVM for pt to return call

## 2014-04-13 NOTE — Telephone Encounter (Signed)
Follow up  ° ° ° °Returning call back to nurse  °

## 2014-04-13 NOTE — Telephone Encounter (Signed)
Pt is aware and will see [place] in a week.  Pt got a M mask now instead of a XS.

## 2014-04-13 NOTE — Telephone Encounter (Signed)
LVM for pt to return call

## 2014-04-23 ENCOUNTER — Encounter: Payer: Self-pay | Admitting: Cardiology

## 2014-04-24 ENCOUNTER — Ambulatory Visit (INDEPENDENT_AMBULATORY_CARE_PROVIDER_SITE_OTHER): Payer: 59 | Admitting: Cardiology

## 2014-04-24 ENCOUNTER — Encounter: Payer: Self-pay | Admitting: Cardiology

## 2014-04-24 VITALS — BP 118/70 | HR 70 | Ht 67.0 in | Wt 115.0 lb

## 2014-04-24 DIAGNOSIS — R0602 Shortness of breath: Secondary | ICD-10-CM

## 2014-04-24 DIAGNOSIS — G4733 Obstructive sleep apnea (adult) (pediatric): Secondary | ICD-10-CM | POA: Insufficient documentation

## 2014-04-24 DIAGNOSIS — I491 Atrial premature depolarization: Secondary | ICD-10-CM

## 2014-04-24 DIAGNOSIS — I4949 Other premature depolarization: Secondary | ICD-10-CM

## 2014-04-24 DIAGNOSIS — I493 Ventricular premature depolarization: Secondary | ICD-10-CM | POA: Insufficient documentation

## 2014-04-24 NOTE — Progress Notes (Signed)
Michele Jones, [address], [place]  [address] Phone: [phone] Fax:  [phone]  Date:  04/24/2014   ID:  Michele Jones, DOB [birth year], MRN [id number]  PCP:  Gerrit Heck, MD  Cardiologist:  Fransico Him, MD     History of Present Illness: Michele Jones is a 63 y.o. female with no prior cardiac history who presents today for followup of palpitations. She was noted on EKG to have an isolated PVC and heart monitor showed NSR with PAC's and PVC's and was started on Toprol.  She also was diagnosed with mild OSA and underwent CPAP titration to 10cm H2O.  She now presents for followup.  She feels like she has more energy since going on the CPAP.  She was having some problems with leakage with the nasal pillow mask but it is still leaking some so she has a appt with [facility] early in June.  She thinks she has been sleeping better since going on the CPAP.  She feels rested in the am and has no daytime sleepiness.  She only took the metoprolol for 3 days and stopped because she did not sleep well and was weak, nauseated, off balance and tired with sadness.   2D echo showed normal LVF with diastolic dysfunction  and Lasix was prescribed but she did not want to take it. She says that she only has a very mild degree of SOB and mild CP but is under a lot of stress.  She had a normal nuclear stress test recently.   Wt Readings from Last 3 Encounters:  04/24/14 115 lb (52.164 kg)  12/19/13 115 lb (52.164 kg)  12/08/13 116 lb (52.617 kg)     Past Medical History  Diagnosis Date  . Allergy   . Osteoporosis   . Fibromyalgia   . Malaria   . OSA (obstructive sleep apnea)     mild with AHI 5.8/hr now on CPAP at 10cm H2o  . PVC's (premature ventricular contractions)   . PAC (premature atrial contraction)     Current Outpatient Prescriptions  Medication Sig Dispense Refill  . Ascorbic Acid (VITAMIN C) 100 MG tablet Take 100 mg by mouth daily.      . Boron 3 MG CAPS Take by  mouth daily.      . cholecalciferol (VITAMIN D) 1000 UNITS tablet Take 1,000 Units by mouth daily.      . Cranberry 200 MG CAPS Take by mouth.      . Digestive Enzymes (SIMILASE PO) Take by mouth daily.      . DiphenhydrAMINE HCl (ALLERGY MED PO) Take by mouth. Sublingual inhalant      . fexofenadine (ALLEGRA) 30 MG tablet Take 30 mg by mouth 2 (two) times daily.      [name] glucosamine-chondroitin 500-400 MG tablet Take 1 tablet by mouth 3 (three) times daily.      . Grape Seed 100 MG CAPS Take by mouth 2 (two) times daily.      . magnesium gluconate (MAGONATE) 500 MG tablet Take 500 mg by mouth daily.      . Multiple Vitamins-Minerals (MULTIVITAMIN WITH MINERALS) tablet Take 1 tablet by mouth daily.      [name] Leaf Extract 150 MG CAPS Take by mouth daily.      [name] OVER THE COUNTER MEDICATION nopalea liquid one tablespoon daily      . PRASTERONE, DHEA, PO Take 0.5 mg by mouth. Takes one in the morning on mondays wednesdays and fridays      .  Quercetin (QUERCITIN) POWD by Does not apply route daily.      [name] thiamine (VITAMIN B-1) 100 MG tablet Take 100 mg by mouth daily.      [name] zolpidem (AMBIEN) 10 MG tablet Take 10 mg by mouth at bedtime as needed for sleep.       No current facility-administered medications for this visit.    Allergies:    Allergies  Allergen Reactions  . Amoxicillin Rash    Thinks it may cause a rash.   Sarina Ill [Sulfamethoxazole-Tmp Ds] Rash    Thinks it may cause a rash.   . Doxycycline Nausea Only    Stomach upset  . Augmentin [Amoxicillin-Pot Clavulanate] Rash  . Levaquin [Levofloxacin In D5w] Rash    Social History:  The patient  reports that she has never smoked. She does not have any smokeless tobacco history on file. She reports that she does not drink alcohol or use illicit drugs.   Family History:  The patient's family history includes Cancer in her father and maternal grandmother; Diabetes in her paternal grandmother; Hypertension in her brother; Stroke in  her mother.   ROS:  Please see the history of present illness.      All other systems reviewed and negative.   PHYSICAL EXAM: VS:  BP 118/70  Pulse 70  Ht 5\' 7"  (1.702 m)  Wt 115 lb (52.164 kg)  BMI 18.01 kg/m2 Well nourished, well developed, in no acute distress HEENT: normal Neck: no JVD Cardiac:  normal S1, S2; RRR; no murmur Lungs:  clear to auscultation bilaterally, no wheezing, rhonchi or rales Abd: soft, nontender, no hepatomegaly Ext: no edema Skin: warm and dry Neuro:  CNs 2-12 intact, no focal abnormalities noted      ASSESSMENT AND PLAN:  1. Symptomatic PVC's and PAC's - she did not tolerate the beta blocker.  She does not want to try any other meds to suppress them at this time., 2. Mild OSA on CPAP - I will get a download from the DME 3.  Mild SOB with diastolic dysfunction on echo but normal LVF and no ischemia on stress test. I suspect this may be due to some underlying anxiety.    Followup with me in 6 months  Signed, Fransico Him, MD 04/24/2014 3:29 PM

## 2014-04-24 NOTE — Patient Instructions (Signed)
Your physician recommends that you continue on your current medications as directed. Please refer to the Current Medication list given to you today.  Your physician wants you to follow-up in: 6 month ov You will receive a reminder letter in the mail two months in advance. If you don't receive a letter, please call our office to schedule the follow-up appointment.  

## 2014-06-02 ENCOUNTER — Encounter: Payer: Self-pay | Admitting: Cardiology

## 2014-06-07 ENCOUNTER — Encounter: Payer: Self-pay | Admitting: General Surgery

## 2014-09-19 ENCOUNTER — Encounter: Payer: Self-pay | Admitting: Cardiology

## 2014-10-18 ENCOUNTER — Ambulatory Visit (INDEPENDENT_AMBULATORY_CARE_PROVIDER_SITE_OTHER): Payer: 59 | Admitting: Cardiology

## 2014-10-18 VITALS — BP 122/72 | HR 71 | Ht 67.0 in | Wt 116.0 lb

## 2014-10-18 DIAGNOSIS — I491 Atrial premature depolarization: Secondary | ICD-10-CM

## 2014-10-18 DIAGNOSIS — G4733 Obstructive sleep apnea (adult) (pediatric): Secondary | ICD-10-CM

## 2014-10-18 DIAGNOSIS — R0602 Shortness of breath: Secondary | ICD-10-CM

## 2014-10-18 DIAGNOSIS — I493 Ventricular premature depolarization: Secondary | ICD-10-CM

## 2014-10-18 DIAGNOSIS — R002 Palpitations: Secondary | ICD-10-CM

## 2014-10-18 DIAGNOSIS — R0981 Nasal congestion: Secondary | ICD-10-CM | POA: Insufficient documentation

## 2014-10-18 NOTE — Patient Instructions (Signed)
Your physician recommends that you continue on your current medications as directed. Please refer to the Current Medication list given to you today.  Your physician recommends that you schedule a follow-up appointment with your pcp Dr.Barnes today @1 :30pm  Your physician wants you to follow-up in: 6 months with Dr.Turner You will receive a reminder letter in the mail two months in advance. If you don't receive a letter, please call our office to schedule the follow-up appointment.

## 2014-10-18 NOTE — Progress Notes (Signed)
[place], [address] [place], [place]  [address] Phone: [phone] Fax:  [phone]  Date:  10/18/2014   ID:  Michele Jones, DOB Mar 27, 1951, MRN [id number]  PCP:  Gerrit Heck, MD  Cardiologist:  Fransico Him, MD    History of Present Illness: Michele Jones is a 64 y.o. female with a history of symptomatic PVC's and PAC's, mild OSA on CPAP and diastolic dysfunction who presents today for followup.  She is doing well with her CPAP. She tolerates her mask and feels the pressure is adequate.  She feels rested in the am and has no daytime sleepiness. She has seen Dr. Erik Obey recently due to chronic sinus congestion which she has had for 20 years and he has referred her to an allergist.  She does not snore.  She denies any chest pain, LE edema, dizziness or syncope.  She will notice palpitations when she has a cold for with coffee.  She has some SOB but this occurs only when she has a sinus infection and describes it that she cannot take in a deep breath.   Wt Readings from Last 3 Encounters:  10/18/14 116 lb (52.617 kg)  04/24/14 115 lb (52.164 kg)  12/19/13 115 lb (52.164 kg)     Past Medical History  Diagnosis Date  . Allergy   . Osteoporosis   . Fibromyalgia   . Malaria   . OSA (obstructive sleep apnea)     mild with AHI 5.8/hr now on CPAP at 10cm H2o  . PVC's (premature ventricular contractions)   . PAC (premature atrial contraction)     Current Outpatient Prescriptions  Medication Sig Dispense Refill  . Ascorbic Acid (VITAMIN C) 100 MG tablet Take 100 mg by mouth daily.    [name] azithromycin (ZITHROMAX) 250 MG tablet Take 250 mg by mouth 2 (two) times daily.  0  . Boron 3 MG CAPS Take by mouth daily.    . budesonide (RHINOCORT AQUA) 32 MCG/ACT nasal spray Place 2 sprays into both nostrils daily.  3  . cholecalciferol (VITAMIN D) 1000 UNITS tablet Take 1,000 Units by mouth daily.    . Cranberry 200 MG CAPS Take by mouth.    . DiphenhydrAMINE HCl  (ALLERGY MED PO) Take by mouth. Sublingual inhalant    . fexofenadine (ALLEGRA) 30 MG tablet Take 30 mg by mouth 2 (two) times daily.    [name] FLUZONE HIGH-DOSE 0.5 ML SUSY   0  . glucosamine-chondroitin 500-400 MG tablet Take 1 tablet by mouth 3 (three) times daily.    . Grape Seed 100 MG CAPS Take by mouth 2 (two) times daily.    . magnesium gluconate (MAGONATE) 500 MG tablet Take 500 mg by mouth daily.    . Multiple Vitamins-Minerals (MULTIVITAMIN WITH MINERALS) tablet Take 1 tablet by mouth daily.    [name] Leaf Extract 150 MG CAPS Take by mouth daily.    [name] OVER THE COUNTER MEDICATION nopalea liquid one tablespoon daily    . PRASTERONE, DHEA, PO Take 0.5 mg by mouth. Takes one in the morning on mondays wednesdays and fridays    . Quercetin (QUERCITIN) POWD by Does not apply route daily.    . RESTASIS 0.05 % ophthalmic emulsion   0  . thiamine (VITAMIN B-1) 100 MG tablet Take 100 mg by mouth daily.    [name] zolpidem (AMBIEN) 10 MG tablet Take 10 mg by mouth at bedtime as needed for sleep.     No current facility-administered medications for  this visit.    Allergies:    Allergies  Allergen Reactions  . Amoxicillin Rash    Thinks it may cause a rash.   Sarina Ill [Sulfamethoxazole-Trimethoprim] Rash    Thinks it may cause a rash.   . Doxycycline Nausea Only    Stomach upset  . Augmentin [Amoxicillin-Pot Clavulanate] Rash  . Levaquin [Levofloxacin In D5w] Rash    Social History:  The patient  reports that she has never smoked. She does not have any smokeless tobacco history on file. She reports that she does not drink alcohol or use illicit drugs.   Family History:  The patient's family history includes Cancer in her father and maternal grandmother; Diabetes in her paternal grandmother; Hypertension in her brother; Stroke in her mother.   ROS:  Please see the history of present illness.      All other systems reviewed and negative.   PHYSICAL EXAM: VS:  BP 122/72 mmHg  Pulse 71  Ht 5'  7" (1.702 m)  Wt 116 lb (52.617 kg)  BMI 18.16 kg/m2 Well nourished, well developed, in no acute distress HEENT: normal Neck: no JVD Cardiac:  normal S1, S2; RRR; no murmur Lungs:  Scattered wheezes Abd: soft, nontender, no hepatomegaly Ext: no edema Skin: warm and dry Neuro:  CNs 2-12 intact, no focal abnormalities noted  EKG:  NSR with LVH by voltage     ASSESSMENT AND PLAN:  1. OSA on CPAP therapy and doing well.  She is tolerating the pressure.  I will get a d/l from her DME. 2. PVC's and PAC's - well tolerated and asymptomatic unless she has a cold 3. Diastolic dysfunction 4. Sinus congestion - referred to Dr. Neldon Mc by Dr. Erik Obey 5. SOB which I think is related to underlying URI with chest congestion.  She has wheezing on exam.  I will get her in to see her PCP today.  Followup with me in 6 months  Signed, Fransico Him, MD [facility] 10/18/2014 11:43 AM

## 2015-01-21 ENCOUNTER — Encounter: Payer: Self-pay | Admitting: Cardiology

## 2015-02-14 ENCOUNTER — Telehealth: Payer: Self-pay | Admitting: Cardiology

## 2015-02-14 DIAGNOSIS — J439 Emphysema, unspecified: Secondary | ICD-10-CM

## 2015-02-14 NOTE — Telephone Encounter (Signed)
On March 4, pt st she went to Dr. Drema Dallas' office and had a chest xray due to sinus infection and L lung pain. Xray showed "severe emphysema without acute cardiopulmonary disease" (patient was reading from report).  Dr. Drema Dallas told her she does not believe she has emphysema, but rather an old lung injury.  She is going to her asthma doctor tomorrow for assessment. She st her CPAP hurts her lungs (not badly, but makes them uncomfortable for the last few months). Patient wanted to let Dr. Radford Pax know she is taking a break from the CPAP until her lung situation is figured out. Patient to call our office again tomorrow to F/U after doctor appointment.   Requested Xray report from Dr. Drema Dallas' office and awaiting fax.

## 2015-02-14 NOTE — Telephone Encounter (Signed)
New message         Pt had a recent scan and it showed some lung problems   pt thinks that it is the cpap that is causing these problems   pt does not want to use it until she knows what is going on   please give pt a call

## 2015-02-15 NOTE — Telephone Encounter (Signed)
Pt st her asthma doctor told her that her O2 was fine and did not drop when she walked. Patient recited a list of labs her doctor is checking today: ANA reflex 2 ENA 9 panel ESR  Alpha 1 antitrypsin serum Alpha 1 antitrypsin total phenotype Angiotensin converting enzyme   Patient to call her doctor and ask if there is any reason why she cannot where her CPAP.  Patient also requesting Dr. Theodosia Blender opinion on what to do as far as wearing her CPAP.

## 2015-02-15 NOTE — Telephone Encounter (Signed)
Follow up      Dr Tina Griffiths went home for the day.  She cannot get that info until Monday. However, he is aware she is using the cpap machine

## 2015-02-15 NOTE — Telephone Encounter (Signed)
Follow up     Pt was told to call Michele Jones today to follow up

## 2015-02-15 NOTE — Telephone Encounter (Signed)
I need to see OV notes from Dr. Drema Dallas and lung MD as well as chest xray

## 2015-02-18 ENCOUNTER — Encounter: Payer: Self-pay | Admitting: Cardiology

## 2015-02-18 NOTE — Telephone Encounter (Signed)
This encounter was created in error - please disregard.

## 2015-02-18 NOTE — Telephone Encounter (Signed)
New message      Dr Tina Griffiths said yes it is ok to use the CPAP machine.  He is her asthma doctor

## 2015-02-18 NOTE — Telephone Encounter (Addendum)
Per patient, Dr Tina Griffiths said yes it is ok to use the CPAP machine. He is her asthma doctor. Called Dr. Lum Keas office and requested notes, but they will not be available for 2 weeks as they still dictate and transcribe their notes. Xray report received from Dr. Drema Dallas' office. Requested OV notes and notes received.  Given to Dr. Radford Pax for review and awaiting instruction.

## 2015-02-19 NOTE — Telephone Encounter (Signed)
Per Dr. Radford Pax, pulmonary referral placed as patient does not have a lung doctor.  Patient agrees with treatment plan.

## 2015-02-28 ENCOUNTER — Telehealth: Payer: Self-pay | Admitting: Cardiology

## 2015-02-28 NOTE — Telephone Encounter (Signed)
New Message  Pt wanted to speak w/ Rn, after pt seeing Asthma MD- concerning sleep apnea machine settings. Please call back and discuss.

## 2015-02-28 NOTE — Telephone Encounter (Signed)
Patient st her allergist, Dr. Tina Griffiths, instructed the patient to speak to Dr. Radford Pax about her CPAP settings. He is concerned that they are not correct. He instructed the patient to ask Dr. Radford Pax about doing another titration on her machine.   To Dr. Radford Pax for recommendations.   Message sent to [facility] for download.

## 2015-03-02 NOTE — Telephone Encounter (Signed)
Please get OV note from her allergist and also get a CPAP d/l from DME

## 2015-03-06 ENCOUNTER — Telehealth: Payer: Self-pay | Admitting: Cardiology

## 2015-03-06 NOTE — Telephone Encounter (Signed)
See phone note below.  

## 2015-03-06 NOTE — Telephone Encounter (Signed)
I informed pt that I will get paperwork from the allergy doctor and make sure we get the d/l from DME.   She stated that she is also going to see a pulmonary doctor next week.

## 2015-03-06 NOTE — Telephone Encounter (Signed)
New Message    Patient needs a call back

## 2015-03-07 ENCOUNTER — Telehealth: Payer: Self-pay | Admitting: Cardiology

## 2015-03-07 NOTE — Telephone Encounter (Signed)
I have requested the last OV from the allergy doctor, and a message has been sent to [facility] requesting a download.

## 2015-03-07 NOTE — Telephone Encounter (Signed)
Follow up:  Dr Bruna Potter office called to speak to [facility] about this pt's notes.   Dr is not in the office and will  Not be able to review note they will get back to [place] Mon. / Tue.

## 2015-03-11 NOTE — Telephone Encounter (Signed)
Requested OV from Dr. Bruna Potter office.

## 2015-03-12 ENCOUNTER — Encounter (INDEPENDENT_AMBULATORY_CARE_PROVIDER_SITE_OTHER): Payer: Self-pay

## 2015-03-12 ENCOUNTER — Encounter: Payer: Self-pay | Admitting: Internal Medicine

## 2015-03-12 ENCOUNTER — Ambulatory Visit (INDEPENDENT_AMBULATORY_CARE_PROVIDER_SITE_OTHER): Payer: Medicare HMO | Admitting: Internal Medicine

## 2015-03-12 VITALS — BP 122/70 | HR 84 | Ht 67.0 in | Wt 121.4 lb

## 2015-03-12 DIAGNOSIS — R0602 Shortness of breath: Secondary | ICD-10-CM

## 2015-03-12 DIAGNOSIS — Z23 Encounter for immunization: Secondary | ICD-10-CM

## 2015-03-12 MED ORDER — MOMETASONE FURO-FORMOTEROL FUM 100-5 MCG/ACT IN AERO: INHALATION_SPRAY | RESPIRATORY_TRACT | Status: DC

## 2015-03-12 NOTE — Progress Notes (Signed)
   Subjective:    Patient ID: Michele Jones, female    DOB: 1950/12/24,    MRN: [id number]  HPI  1 yowf never smoker with tendency to recurrent sinus infection x around 2000 eval by Dr Melvyn Neth and Neldon Mc who dx as asthma but since 2015 has noted doe not better between flares of sinus dz with w/u c/w AZ/? Emphysema so referred to pulmonary Clinic by Dr Fransico Him     03/12/2015 1st [facility] office visit/ Gaylon Melchor   Chief Complaint  Patient presents with  . Pulmonary Consult    Referred by Dr. 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  She c/o "lungs hurt and hard to breathe"- started 20 yrs ago with "chronic sinusitis".    even between sinus flares has difficulty with doe with sense of chest tightness/can't take a deep breath present at rest but also with exertion like getting in a hurry walking aor doing a flight of steps / some better with albuterol despite maint rx with pulmocort  No obvious   patterns in day to day or daytime variabilty or assoc chronic cough or cp or  subjective wheeze or overt  hb symptoms. No unusual exp hx or h/o childhood pna/ asthma or knowledge of premature birth.  Sleeping ok without nocturnal  or early am exacerbation  of respiratory  c/o's or need for noct saba. Also denies any obvious fluctuation of symptoms with weather or environmental changes or other aggravating or alleviating factors except as outlined above   Current Medications, Allergies, Complete Past Medical History, Past Surgical History, Family History, and Social History were reviewed in [employer] record.          Review of Systems  Constitutional: Negative for fever, chills and unexpected weight change.  HENT: Positive for postnasal drip, sinus pressure and trouble swallowing. Negative for congestion, dental problem, ear pain, nosebleeds, rhinorrhea, sneezing, sore throat and voice change.   Eyes: Negative for visual disturbance.    Respiratory: Positive for shortness of breath. Negative for cough and choking.   Cardiovascular: Negative for chest pain and leg swelling.  Gastrointestinal: Negative for vomiting, abdominal pain and diarrhea.  Genitourinary: Negative for difficulty urinating.  Musculoskeletal: Negative for arthralgias.  Skin: Negative for rash.  Neurological: Positive for headaches. Negative for tremors and syncope.  Hematological: Does not bruise/bleed easily.       Objective:   Physical Exam  amb wf nad  Wt Readings from Last 3 Encounters:  03/12/15 121 lb 6.4 oz (55.067 kg)  10/18/14 116 lb (52.617 kg)  04/24/14 115 lb (52.164 kg)    Vital signs reviewed  HEENT: nl dentition, turbinates, and orophanx. Nl external ear canals without cough reflex   NECK :  without JVD/Nodes/TM/ nl carotid upstrokes bilaterally   LUNGS: no acc muscle use, clear to A and P bilaterally without cough on insp or exp maneuvers   CV:  RRR  no s3 or murmur or increase in P2, no edema   ABD:  soft and nontender with nl excursion in the supine position. No bruits or organomegaly, bowel sounds nl  MS:  warm without deformities, calf tenderness, cyanosis or clubbing  SKIN: warm and dry without lesions    NEURO:  alert, approp, no deficits    cxr 3/4/16report reviewed:   Hyperinflation with apical scarring      Assessment & Plan:

## 2015-03-12 NOTE — Patient Instructions (Signed)
Prevar -13 today   Stop pulmocort  Start dulera 100 Take 2 puffs first thing in am and then another 2 puffs about 12 hours later.   Work on inhaler technique:  relax and gently blow all the way out then take a nice smooth deep breath back in, triggering the inhaler at same time you start breathing in.  Hold for up to 5 seconds if you can.  Rinse and gargle with water when done.  Blow out through your nose      Only use your albuterol as a rescue medication to be used if you can't catch your breath by resting or doing a relaxed purse lip breathing pattern.  - The less you use it, the better it will work when you need it. - Ok to use up to 2 puffs  every 4 hours if you must but call for immediate appointment if use goes up over your usual need - Don't leave home without it !!  (think of it like the spare tire for your car)   Please schedule a follow up office visit in 4 weeks, sooner if needed with pft's

## 2015-03-12 NOTE — Assessment & Plan Note (Signed)
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 DDX of  difficult airways management all start with A and  include Adherence, Ace Inhibitors, Acid Reflux, Active Sinus Disease, Alpha 1 Antitripsin deficiency, Anxiety masquerading as Airways dz,  ABPA,  allergy(esp in young), Aspiration (esp in elderly), Adverse effects of DPI,  Active smokers, plus two Bs  = Bronchiectasis and Beta blocker use..and one C= CHF  Adherence is always the initial "prime suspect" and is a multilayered concern that requires a "trust but verify" approach in every patient - starting with knowing how to use medications, especially inhalers, correctly, keeping up with refills and understanding the fundamental difference between maintenance and prns vs those medications only taken for a very short course and then stopped and not refilled.  -The proper method of use, as well as anticipated side effects, of a metered-dose inhaler are discussed and demonstrated to the patient. 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  Please see instructions for details which were reviewed in writing and the patient given a copy.

## 2015-03-19 NOTE — Telephone Encounter (Signed)
3/18 [place] office note received.  Given to Dr. Radford Pax for review.

## 2015-03-19 NOTE — Telephone Encounter (Signed)
01/04/15 OV received.  Called office and requested most recent OV from 3/18. It is now available and is being sent to the office.

## 2015-03-26 ENCOUNTER — Encounter: Payer: Self-pay | Admitting: Cardiology

## 2015-04-09 ENCOUNTER — Encounter: Payer: Self-pay | Admitting: Cardiology

## 2015-04-10 ENCOUNTER — Ambulatory Visit (INDEPENDENT_AMBULATORY_CARE_PROVIDER_SITE_OTHER): Payer: Medicare HMO | Admitting: Internal Medicine

## 2015-04-10 ENCOUNTER — Encounter: Payer: Self-pay | Admitting: Internal Medicine

## 2015-04-10 VITALS — BP 112/70 | HR 71 | Ht 67.0 in | Wt 120.0 lb

## 2015-04-10 DIAGNOSIS — R0602 Shortness of breath: Secondary | ICD-10-CM | POA: Diagnosis not present

## 2015-04-10 LAB — PULMONARY FUNCTION TEST
DL/VA % pred: 93 %
DL/VA: 4.84 ml/min/mmHg/L
DLCO unc % pred: 78 %
DLCO unc: 22.19 ml/min/mmHg
FEF 25-75 Post: 1.91 L/sec
FEF 25-75 Pre: 1.58 L/sec
FEF2575-%Change-Post: 21 %
FEF2575-%PRED-PRE: 69 %
FEF2575-%Pred-Post: 84 %
FEV1-%Change-Post: 7 %
FEV1-%Pred-Post: 91 %
FEV1-%Pred-Pre: 84 %
FEV1-Post: 2.45 L
FEV1-Pre: 2.27 L
FEV1FVC-%Change-Post: 7 %
FEV1FVC-%Pred-Pre: 93 %
FEV6-%CHANGE-POST: 0 %
FEV6-%PRED-PRE: 92 %
FEV6-%Pred-Post: 93 %
FEV6-PRE: 3.13 L
FEV6-Post: 3.15 L
FEV6FVC-%Change-Post: 0 %
FEV6FVC-%PRED-POST: 104 %
FEV6FVC-%Pred-Pre: 103 %
FVC-%Change-Post: 0 %
FVC-%PRED-POST: 89 %
FVC-%Pred-Pre: 89 %
FVC-PRE: 3.14 L
FVC-Post: 3.15 L
PRE FEV6/FVC RATIO: 100 %
Post FEV1/FVC ratio: 78 %
Post FEV6/FVC ratio: 100 %
Pre FEV1/FVC ratio: 72 %
RV % PRED: 97 %
RV: 2.21 L
TLC % PRED: 93 %
TLC: 5.13 L

## 2015-04-10 NOTE — Patient Instructions (Addendum)
Work on inhaler technique:  relax and gently blow all the way out then take a nice smooth deep breath back in, triggering the inhaler at same time you start breathing in.  Hold for up to 5 seconds if you can and blow it out through your nose.  Rinse and gargle with water when done   You do not have sign copd or emphysema and not likely it all you ever will   GERD (REFLUX)  is an extremely common cause of respiratory symptoms just like yours , many times with no obvious heartburn at all.    It can be treated with medication, but also with lifestyle changes including avoidance of late meals, excessive alcohol, smoking cessation, and avoid fatty foods, chocolate, peppermint, colas, red wine, and acidic juices such as orange juice.  NO MINT OR MENTHOL PRODUCTS SO NO COUGH DROPS  USE SUGARLESS CANDY INSTEAD (Jolley ranchers or Stover's or Life Savers) or even ice chips will also do - the key is to swallow to prevent all throat clearing. NO OIL BASED VITAMINS - use powdered substitutes.    Follow up with Dr Neldon Mc and [facility]

## 2015-04-10 NOTE — Progress Notes (Signed)
PFT done today. 

## 2015-04-10 NOTE — Progress Notes (Signed)
Subjective:    Patient ID: Michele Jones, female    DOB: 1951/09/28,    MRN: [id number]    Brief patient profile:  49 yowf never smoker with tendency to recurrent sinus infections x around 2000 eval by Dr Melvyn Neth and Neldon Mc who dx as asthma but since 2015 has noted doe not better between flares of sinus dz with w/u c/w AZ/? Emphysema so referred to pulmonary Clinic by Dr Fransico Him with nl pfts 04/10/15    History of Present Illness  03/12/2015 1st [facility] office visit/ [name]   Chief Complaint  Patient presents with  . Pulmonary Consult    Referred by Dr. 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  She c/o "lungs hurt and hard to breathe"- started 20 yrs ago with "chronic sinusitis".    even between sinus flares has difficulty with doe with sense of chest tightness/can't take a deep breath present at rest but also with exertion like getting in a hurry walking or doing a flight of steps / some better with albuterol despite maint rx with pulmocort rec Prevar -13 today  Stop pulmocort Start dulera 100 Take 2 puffs first thing in am and then another 2 puffs about 12 hours later Only use your albuterol as a rescue medication   04/10/2015 f/u ov/[name] re:  MZ with "copd" on cxr but nl pfts/ on dulera 100 2bid  Chief Complaint  Patient presents with  . Follow-up    PFT done today.  Pt states her breathing is about the same since last visit. She states "pain in lungs" occurs less. She c/o "alot of phelm in my throat".    fleeting L cp / migratory No change ex tol on dulera, still doe x get in hurry or steps   No obvious day to day or daytime variabilty or assoc excess or purulent sputum  or cp or chest tightness, subjective wheeze overt sinus or hb symptoms. No unusual exp hx or h/o childhood pna/ asthma or knowledge of premature birth.  Sleeping ok without nocturnal  or early am exacerbation  of respiratory  c/o's or need for noct saba. Also denies any obvious  fluctuation of symptoms with weather or environmental changes or other aggravating or alleviating factors except as outlined above   Current Medications, Allergies, Complete Past Medical History, Past Surgical History, Family History, and Social History were reviewed in [employer] record.  ROS  The following are not active complaints unless bolded sore throat, dysphagia, dental problems, itching, sneezing,  nasal congestion or excess/ purulent secretions, ear ache,   fever, chills, sweats, unintended wt loss, pleuritic or exertional cp, hemoptysis,  orthopnea pnd or leg swelling, presyncope, palpitations, heartburn, abdominal pain, anorexia, nausea, vomiting, diarrhea  or change in bowel or urinary habits, change in stools or urine, dysuria,hematuria,  rash, arthralgias, visual complaints, headache, numbness weakness or ataxia or problems with walking or coordination,  change in mood/affect or memory.                 Objective:   Physical Exam  amb wf nad  .04/10/2015     120 Wt Readings from Last 3 Encounters:  03/12/15 121 lb 6.4 oz (55.067 kg)  10/18/14 116 lb (52.617 kg)  04/24/14 115 lb (52.164 kg)    Vital signs reviewed  HEENT: nl dentition, turbinates, and orophanx. Nl external ear canals without cough reflex   NECK :  without JVD/Nodes/TM/ nl carotid upstrokes bilaterally  LUNGS: no acc muscle use, clear to A and P bilaterally without cough on insp or exp maneuvers/ trace pseudowheeze    CV:  RRR  no s3 or murmur or increase in P2, no edema   ABD:  soft and nontender with nl excursion in the supine position. No bruits or organomegaly, bowel sounds nl  MS:  warm without deformities, calf tenderness, cyanosis or clubbing  SKIN: warm and dry without lesions    NEURO:  alert, approp, no deficits    cxr 3/4/16report reviewed: Hyperinflation with apical scarring      Assessment & Plan:

## 2015-04-11 ENCOUNTER — Encounter: Payer: Self-pay | Admitting: Internal Medicine

## 2015-04-11 NOTE — Assessment & Plan Note (Addendum)
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  - 03/12/2015 p extensive coaching HFA effectiveness =    75% > try off pulmocort and on dulera 100 2bid > no change symptoms - PFTs 04/10/2015  FEV1  2.45 ( 91%) ratio 78 before using any dulera / dlco 78%  No change p saba    I had an extended final summary discussion with the patient reviewing all relevant studies completed to date and  lasting 15 to 20 minutes of a 25 minute visit on the following issues:    1) her symptoms are atypical and although she could still have asthma she does not have copd and very unlikely she ever will - her cxr is just read as copd due to body habitus and the nl loss of elasticity that occurs with aging but note her age adjusted flow rates are perfectly nl  2) main symptoms are upper airway in nature and not likely related to AZ phenotype  3) needs to be more consistent with hfa technique and gerd rx/ diet pending f/u with Dr Erik Obey and [place]  4) warned regarding mixing and matching so many different "alternative meds" which could be part of her problem  5) pulmonary f/u is prn

## 2015-04-15 ENCOUNTER — Encounter: Payer: Self-pay | Admitting: Cardiology

## 2015-04-16 ENCOUNTER — Encounter: Payer: Self-pay | Admitting: Cardiology

## 2015-04-16 ENCOUNTER — Ambulatory Visit (INDEPENDENT_AMBULATORY_CARE_PROVIDER_SITE_OTHER): Payer: Medicare HMO | Admitting: Cardiology

## 2015-04-16 VITALS — BP 130/52 | HR 66 | Ht 67.0 in | Wt 118.0 lb

## 2015-04-16 DIAGNOSIS — G4733 Obstructive sleep apnea (adult) (pediatric): Secondary | ICD-10-CM | POA: Diagnosis not present

## 2015-04-16 NOTE — Patient Instructions (Signed)
Medication Instructions:  Your physician recommends that you continue on your current medications as directed. Please refer to the Current Medication list given to you today.   Labwork: None  Testing/Procedures: None  Follow-Up: Your physician wants you to follow-up in: 1 year with Dr. Radford Pax. You will receive a reminder letter in the mail two months in advance. If you don't receive a letter, please call our office to schedule the follow-up appointment.   Any Other Special Instructions Will Be Listed Below (If Applicable). I will call you in about a month to let you know how your new CPAP setting is going for you.

## 2015-04-16 NOTE — Progress Notes (Signed)
Cardiology Office Note   Date:  04/16/2015   ID:  Emylia Latella, DOB [birth year], MRN [id number]  PCP:  Gerrit Heck, MD    Chief Complaint  Patient presents with  . Follow-up    Sleep Apnea      History of Present Illness: Lesbia Ottaway is a 64 y.o. female with a history of symptomatic PVC's and PAC's, mild OSA on CPAP and diastolic dysfunction who presents today for followup. She is doing well with her CPAP. She tolerates her mask and feels the pressure is adequate. She feels rested in the am and has no daytime sleepiness.  She does not snore. She denies any chest pain, LE edema, dizziness or syncope. She will notice palpitations when she has a cold or with coffee. She says that she will have some problems with her lungs hurting when she lays down at night and when she gets up in the am.  She complains of chronic sinus infections and has had an extensive w/u by the allergist.  She had been doing a lot of yard work recently and says that her lungs start to hurt more when she has the sinus infections and will be more SOB.    Past Medical History  Diagnosis Date  . Allergy   . Osteoporosis   . Fibromyalgia   . Malaria   . OSA (obstructive sleep apnea)     mild with AHI 5.8/hr now on CPAP at 10cm H2o  . PVC's (premature ventricular contractions)   . PAC (premature atrial contraction)     Past Surgical History  Procedure Laterality Date  . Appendectomy    . Temporomandibular joint surgery       Current Outpatient Prescriptions  Medication Sig Dispense Refill  . Albuterol Sulfate 108 (90 BASE) MCG/ACT AEPB Inhale 2 puffs into the lungs every 4 (four) hours as needed.    . Boron 3 MG CAPS Take by mouth daily.    . Calcium-Vitamin A-Vitamin D (LIQUID CALCIUM PO) Take 5 mLs by mouth daily.    [name] glucosamine-chondroitin 500-400 MG tablet Take 1 tablet by mouth 3 (three) times daily.    . Grape Seed 100 MG CAPS Take by mouth 2 (two) times daily.    .  mometasone-formoterol (DULERA) 100-5 MCG/ACT AERO Take 2 puffs first thing in am and then another 2 puffs about 12 hours later. 1 Inhaler 11  . Multiple Vitamins-Minerals (MULTIVITAMIN WITH MINERALS) tablet Take 1 tablet by mouth daily.    [name] Leaf Extract 150 MG CAPS Take by mouth daily.    [name] PRASTERONE, DHEA, PO Take 0.5 mg by mouth. Takes one in the morning on mondays wednesdays and fridays    . Quercetin (QUERCITIN) POWD by Does not apply route daily.    . RESTASIS 0.05 % ophthalmic emulsion   0  . thiamine (VITAMIN B-1) 100 MG tablet Take 100 mg by mouth daily.    [name] zolpidem (AMBIEN) 10 MG tablet Take 10 mg by mouth at bedtime as needed for sleep.     No current facility-administered medications for this visit.    Allergies:   Amoxicillin; Septra; Doxycycline; Augmentin; and Levaquin    Social History:  The patient  reports that she has never smoked. She has never used smokeless tobacco. She reports that she does not drink alcohol or use illicit drugs.   Family History:  The patient's family history includes Cancer in her father and maternal grandmother; Diabetes in her paternal grandmother; Hypertension in  her brother; Stroke in her mother.    ROS:  Please see the history of present illness.   Otherwise, review of systems are positive for none.   All other systems are reviewed and negative.    PHYSICAL EXAM: VS:  Ht 5\' 7"  (1.702 m)  Wt 118 lb (53.524 kg)  BMI 18.48 kg/m2 , BMI Body mass index is 18.48 kg/(m^2). GEN: Well nourished, well developed, in no acute distress HEENT: normal Neck: no JVD, carotid bruits, or masses Cardiac: RRR; no murmurs, rubs, or gallops,no edema  Respiratory:  clear to auscultation bilaterally, normal work of breathing GI: soft, nontender, nondistended, + BS MS: no deformity or atrophy Skin: warm and dry, no rash Neuro:  Strength and sensation are intact Psych: euthymic mood, full affect   EKG:  EKG is not ordered today.    Recent  Labs: No results found for requested labs within last 365 days.    Lipid Panel No results found for: CHOL, TRIG, HDL, CHOLHDL, VLDL, LDLCALC, LDLDIRECT    Wt Readings from Last 3 Encounters:  04/16/15 118 lb (53.524 kg)  04/10/15 120 lb (54.432 kg)  03/12/15 121 lb 6.4 oz (55.067 kg)     ASSESSMENT AND PLAN:  1. OSA on CPAP therapy and doing well. She is tolerating the pressure. Her d/l today showed an AHI of 0.4/hr on 10cm H2O and 87% compliance in using more than 4 hours nightly.  Since her AHI is good I will decrease her CPAP to 8cm H2O and get another d/l in 4 weeks.   2. PVC's and PAC's - well tolerated and asymptomatic unless she has a cold 3. Diastolic dysfunction 4. Chronic SOB related to allergies with no ischemia on stress test and normal LVF on echo  Current medicines are reviewed at length with the patient today.  The patient does not have concerns regarding medicines.  The following changes have been made:  no change  Labs/ tests ordered today include: see above assessment and plan No orders of the defined types were placed in this encounter.     Disposition:   FU with me in 1 year   Signed, Sueanne Margarita, MD  04/16/2015 10:54 AM    [facility] [address], [place], [place]  [address] Phone: [phone]; Fax: [phone]

## 2015-04-19 ENCOUNTER — Encounter: Payer: Self-pay | Admitting: Cardiology

## 2015-04-26 ENCOUNTER — Telehealth: Payer: Self-pay | Admitting: Cardiology

## 2015-04-26 NOTE — Telephone Encounter (Signed)
New Prob    Pt has some specific questions regarding pressure changes on her CPAP machine. Please call.

## 2015-04-26 NOTE — Telephone Encounter (Signed)
Reviewed pt's where an order was put in to decrease pressure.  I sent [facility] another message to make sure it was received.   Pt stated verbal understanding.

## 2015-05-22 ENCOUNTER — Encounter: Payer: Self-pay | Admitting: Cardiology

## 2015-06-18 ENCOUNTER — Telehealth: Payer: Self-pay | Admitting: Cardiology

## 2015-06-18 NOTE — Telephone Encounter (Signed)
New message     Pt calling to thank you for the letter regarding CPAP machine for traveling purposes.

## 2015-08-31 DIAGNOSIS — J0100 Acute maxillary sinusitis, unspecified: Secondary | ICD-10-CM | POA: Diagnosis not present

## 2015-09-10 DIAGNOSIS — E049 Nontoxic goiter, unspecified: Secondary | ICD-10-CM | POA: Diagnosis not present

## 2015-09-10 DIAGNOSIS — R002 Palpitations: Secondary | ICD-10-CM | POA: Diagnosis not present

## 2015-09-10 DIAGNOSIS — D509 Iron deficiency anemia, unspecified: Secondary | ICD-10-CM | POA: Diagnosis not present

## 2015-09-10 DIAGNOSIS — N951 Menopausal and female climacteric states: Secondary | ICD-10-CM | POA: Diagnosis not present

## 2015-09-16 DIAGNOSIS — Z23 Encounter for immunization: Secondary | ICD-10-CM | POA: Diagnosis not present

## 2015-09-17 ENCOUNTER — Ambulatory Visit: Payer: BC Managed Care – PPO | Admitting: Allergy and Immunology

## 2015-10-01 DIAGNOSIS — G4733 Obstructive sleep apnea (adult) (pediatric): Secondary | ICD-10-CM | POA: Diagnosis not present

## 2015-10-15 ENCOUNTER — Encounter: Payer: Self-pay | Admitting: Allergy and Immunology

## 2015-10-15 ENCOUNTER — Ambulatory Visit (INDEPENDENT_AMBULATORY_CARE_PROVIDER_SITE_OTHER): Payer: Medicare HMO | Admitting: Allergy and Immunology

## 2015-10-15 VITALS — BP 132/78 | HR 80 | Resp 18

## 2015-10-15 DIAGNOSIS — J387 Other diseases of larynx: Secondary | ICD-10-CM

## 2015-10-15 DIAGNOSIS — J4530 Mild persistent asthma, uncomplicated: Secondary | ICD-10-CM

## 2015-10-15 DIAGNOSIS — K219 Gastro-esophageal reflux disease without esophagitis: Secondary | ICD-10-CM

## 2015-10-15 DIAGNOSIS — J309 Allergic rhinitis, unspecified: Secondary | ICD-10-CM | POA: Diagnosis not present

## 2015-10-15 DIAGNOSIS — H1010 Acute atopic conjunctivitis, unspecified eye: Secondary | ICD-10-CM | POA: Diagnosis not present

## 2015-10-15 MED ORDER — AZITHROMYCIN 250 MG PO TABS: ORAL_TABLET | ORAL | Status: DC

## 2015-10-15 NOTE — Patient Instructions (Signed)
  1. Azithromycin 250mg  one tablet one time a day for 10 days  2. OTC Ranitidine 150mg  two tablets one time per day  3. Continue Dulera 100 two inhalations two times per day  4. Continue benadryl, proair respiclick if needed.   5. Continue OTC rhinocort one spray each nostril 3 times per week.   6. Further evaluation?  7. Return in February 2017 or earlier if problem

## 2015-10-15 NOTE — Progress Notes (Signed)
[facility]  Follow-up Note  Refering Provider: Leighton Ruff, MD Primary Provider: Gerrit Heck, MD  Subjective:   Michele Jones is a 64 y.o. 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 Overall her lower respiratory tract is been doing relatively well. She does not need to use a short-acting bronchodilator very often while continuing on her Dulera 100 2 inhalations twice a day. However, she does relate this history of developing these intermittent sharp pains in her lower chest region around the bottom of her rib cage occurring on a daily basis a possibly 3 times per day for the last few months. She's also been somewhat hoarse. She does not use any therapy for her reflux and her LPR because of the fear of using a proton pump inhibitor in exacerbating her untreated osteoporosis. She has had problems with her upper airway with intermittent left sinus pain and right ear pain and sneezing and head fullness and intermittent headache and occasional runny nose and fatigue along with intermittent dizziness. All of these issues have been long-standing in nature. In the past she has always found that azithromycin results and dramatic improvement regarding all these symptoms and she is asking for azithromycin once again. Previously she has had CT scans which have not identified a significant amount of sinusitis while she remains symptomatic. She did have an episode of epistaxis on the left. She continues to use her Rhinocort a few times per week area   Outpatient Encounter Prescriptions as of 10/15/2015  Medication Sig  . Albuterol Sulfate 108 (90 BASE) MCG/ACT AEPB Inhale 2 puffs into the lungs every 4 (four)  hours as needed.  . Boron 3 MG CAPS Take by mouth daily.  . Calcium-Vitamin A-Vitamin D (LIQUID CALCIUM PO) Take 5 mLs by mouth daily.  [name] glucosamine-chondroitin 500-400 MG tablet Take 1 tablet by mouth 3 (three) times daily.  . Grape Seed 100 MG CAPS Take by mouth 2 (two) times daily.  . mometasone-formoterol (DULERA) 100-5 MCG/ACT AERO Take 2 puffs first thing in am and then another 2 puffs about 12 hours later.  . Multiple Vitamins-Minerals (MULTIVITAMIN WITH MINERALS) tablet Take 1 tablet by mouth daily.  [name] Leaf Extract 150 MG CAPS Take by mouth daily.  [name] PRASTERONE, DHEA, PO Take 0.5 mg by mouth. Takes one in the morning on mondays wednesdays and fridays  . Quercetin (QUERCITIN) POWD by Does not apply route daily.  . RESTASIS 0.05 % ophthalmic emulsion   . thiamine (VITAMIN B-1) 100 MG tablet Take 100 mg by mouth daily.  [name] azithromycin (ZITHROMAX) 250 MG tablet Take one tablet once daily for ten days.  [name] FLUZONE HIGH-DOSE 0.5 ML SUSY TO BE ADMINISTERED BY PHARMACIST FOR IMMUNIZATION  . mometasone-formoterol (DULERA) 200-5 MCG/ACT AERO Inhale 2 puffs into the lungs 2 (two) times daily.  [name] zolpidem (AMBIEN) 10 MG tablet Take 10 mg by mouth at bedtime as needed for sleep.   No facility-administered encounter medications on file as of 10/15/2015.    Meds ordered this encounter  Medications  . azithromycin (ZITHROMAX) 250 MG tablet    Sig: Take one tablet once daily for ten days.    Dispense:  10 tablet    Refill:  0    Past  Medical History  Diagnosis Date  . Allergy   . Osteoporosis   . Fibromyalgia   . Malaria   . OSA (obstructive sleep apnea)     mild with AHI 5.8/hr now on CPAP at 10cm H2o  . PVC's (premature ventricular contractions)   . PAC (premature atrial contraction)     Past Surgical History  Procedure Laterality Date  . Appendectomy    . Temporomandibular joint surgery      Allergies  Allergen Reactions  . Amoxicillin Rash    Thinks it may cause a  rash.   Sarina Ill [Sulfamethoxazole-Trimethoprim] Rash    Thinks it may cause a rash.   . Doxycycline Nausea Only    Stomach upset  . Augmentin [Amoxicillin-Pot Clavulanate] Rash  . Levaquin [Levofloxacin In D5w] Rash    Review of Systems  Constitutional: Positive for fatigue. Negative for fever and chills.  HENT: Positive for congestion, nosebleeds, postnasal drip, sneezing, sore throat and voice change. Negative for ear pain, facial swelling, rhinorrhea, sinus pressure, tinnitus and trouble swallowing.   Eyes: Negative for pain, discharge, redness and itching.  Respiratory: Negative for cough, choking, chest tightness, shortness of breath, wheezing and stridor.   Cardiovascular: Negative for chest pain and leg swelling.  Gastrointestinal: Negative for nausea, vomiting and abdominal pain.  Musculoskeletal: Negative for myalgias and arthralgias.  Allergic/Immunologic: Negative.   Neurological: Positive for dizziness.  Hematological: Negative for adenopathy.     Objective:   Filed Vitals:   10/15/15 1142  BP: 132/78  Pulse: 80  Resp: 18          Physical Exam  Constitutional: She appears well-developed and well-nourished. No distress.  Hoarse voice  HENT:  Head: Normocephalic and atraumatic. Head is without right periorbital erythema and without left periorbital erythema.  Right Ear: Tympanic membrane, external ear and ear canal normal. No drainage or tenderness. No foreign bodies. Tympanic membrane is not injected, not scarred, not perforated, not erythematous, not retracted and not bulging. No middle ear effusion.  Left Ear: Tympanic membrane, external ear and ear canal normal. No drainage or tenderness. No foreign bodies. Tympanic membrane is not injected, not scarred, not perforated, not erythematous, not retracted and not bulging.  No middle ear effusion.  Nose: Nose normal. No mucosal edema, rhinorrhea, nose lacerations or sinus tenderness.  No foreign bodies.   Mouth/Throat: Oropharynx is clear and moist. No oropharyngeal exudate, posterior oropharyngeal edema, posterior oropharyngeal erythema or tonsillar abscesses.  Eyes: Lids are normal. Right eye exhibits no chemosis, no discharge and no exudate. No foreign body present in the right eye. Left eye exhibits no chemosis, no discharge and no exudate. No foreign body present in the left eye. Right conjunctiva is not injected. Left conjunctiva is not injected.  Neck: Neck supple. No tracheal tenderness present. No tracheal deviation and no edema present. No thyroid mass and no thyromegaly present.  Cardiovascular: Normal rate, regular rhythm, S1 normal and S2 normal.  Exam reveals no gallop.   No murmur heard. Pulmonary/Chest: No accessory muscle usage or stridor. No respiratory distress. She has no wheezes. She has no rhonchi. She has no rales.  Abdominal: Soft.  Lymphadenopathy:       Head (right side): No tonsillar adenopathy present.       Head (left side): No tonsillar adenopathy present.    She has no cervical adenopathy.  Neurological: She is alert.  Skin: No rash noted. She is not diaphoretic.  Psychiatric: She has a normal mood and affect. Her  behavior is normal.    Diagnostics:    Spirometry was performed and demonstrated an FEV1 of 2.23 at 88 % of predicted.  The patient had an Asthma Control Test with the following results: ACT Total Score: 8.    Assessment and Plan:   1. Mild persistent asthma, uncomplicated   2. Allergic rhinoconjunctivitis   3. LPRD (laryngopharyngeal reflux disease)      1. Azithromycin 250mg  one tablet one time a day for 10 days  2. OTC Ranitidine 150mg  two tablets one time per day  3. Continue Dulera 100 two inhalations two times per day  4. Continue benadryl, proair respiclick if needed.   5. Continue OTC rhinocort one spray each nostril 3 times per week.   6. Further evaluation?  7. Return in February 2017 or earlier if problem  I will give  Michele Jones azithromycin not so much for treating a bacterial infection as to help with inflammation affecting her respiratory tract. She appears to get a very good result will utilizing these medications and her benefit is probably a result of the anti-inflammatory property and anti-mucous property of this agent. I will also treat her for reflux utilizing an H2 receptor blocker instead of a proton pump inhibitor given her fear of using a proton pump inhibitor regarding her untreated osteoporosis. Hopefully the administration of this agent will help with her rib cage and abdominal complaints. I had many discussions with her in the past about treating her osteoporosis but she is very resistant to utilizing any agents to do so at this point. She is going to keep in contact with me noting her response to the a for mentioned therapy and we will see her back in this clinic pending her response.   Allena Katz, MD [place]

## 2015-10-29 ENCOUNTER — Ambulatory Visit: Payer: BC Managed Care – PPO | Admitting: Allergy and Immunology

## 2015-10-30 ENCOUNTER — Telehealth: Payer: Self-pay | Admitting: Allergy and Immunology

## 2015-10-30 DIAGNOSIS — M5416 Radiculopathy, lumbar region: Secondary | ICD-10-CM | POA: Diagnosis not present

## 2015-10-30 DIAGNOSIS — M3500 Sicca syndrome, unspecified: Secondary | ICD-10-CM | POA: Diagnosis not present

## 2015-10-30 DIAGNOSIS — M545 Low back pain: Secondary | ICD-10-CM | POA: Diagnosis not present

## 2015-10-30 DIAGNOSIS — M79641 Pain in right hand: Secondary | ICD-10-CM | POA: Diagnosis not present

## 2015-10-30 DIAGNOSIS — M7062 Trochanteric bursitis, left hip: Secondary | ICD-10-CM | POA: Diagnosis not present

## 2015-10-30 DIAGNOSIS — M79642 Pain in left hand: Secondary | ICD-10-CM | POA: Diagnosis not present

## 2015-10-30 NOTE — Telephone Encounter (Signed)
Please inform patient to take the prednisone prescribed by her rheumatologist. And add nasal saline, mucinex dm, antihistamine, and ibuprofen if needed

## 2015-10-30 NOTE — Telephone Encounter (Signed)
Pt walked in to make an apt with Dr. Raliegh Ip. She just finished a 10-day antibiotic. Has a new sickness. Symptoms are headache, sneezing, cough that morphs into wheeze. Wheezing is new for her. She is coming in next Wednesday, 11/06/15

## 2015-10-30 NOTE — Telephone Encounter (Signed)
Called and informed patient. Patient will contact us back if any problems.

## 2015-10-30 NOTE — Telephone Encounter (Signed)
Called and spoke to patient who has finished the antibiotic and is now feeling sick with a headache, sneezing, cough and wheezing. Patinet is asking if there is anything she could do to help before she comes in for her appointment? Patient went to the rheumatologist and was prescribed Prednisone for help with her hip but hasnt picked up yet from pharmacy.

## 2015-11-06 ENCOUNTER — Ambulatory Visit: Payer: BC Managed Care – PPO | Admitting: Allergy and Immunology

## 2015-11-17 DIAGNOSIS — R300 Dysuria: Secondary | ICD-10-CM | POA: Diagnosis not present

## 2015-11-18 DIAGNOSIS — N200 Calculus of kidney: Secondary | ICD-10-CM | POA: Diagnosis not present

## 2015-12-04 DIAGNOSIS — M3500 Sicca syndrome, unspecified: Secondary | ICD-10-CM | POA: Diagnosis not present

## 2015-12-04 DIAGNOSIS — M5416 Radiculopathy, lumbar region: Secondary | ICD-10-CM | POA: Diagnosis not present

## 2015-12-04 DIAGNOSIS — R69 Illness, unspecified: Secondary | ICD-10-CM | POA: Diagnosis not present

## 2015-12-18 DIAGNOSIS — R209 Unspecified disturbances of skin sensation: Secondary | ICD-10-CM | POA: Diagnosis not present

## 2015-12-18 DIAGNOSIS — I7300 Raynaud's syndrome without gangrene: Secondary | ICD-10-CM | POA: Diagnosis not present

## 2015-12-18 DIAGNOSIS — E049 Nontoxic goiter, unspecified: Secondary | ICD-10-CM | POA: Diagnosis not present

## 2015-12-18 DIAGNOSIS — J45909 Unspecified asthma, uncomplicated: Secondary | ICD-10-CM | POA: Diagnosis not present

## 2015-12-18 DIAGNOSIS — R42 Dizziness and giddiness: Secondary | ICD-10-CM | POA: Diagnosis not present

## 2015-12-18 DIAGNOSIS — R531 Weakness: Secondary | ICD-10-CM | POA: Diagnosis not present

## 2015-12-18 DIAGNOSIS — M810 Age-related osteoporosis without current pathological fracture: Secondary | ICD-10-CM | POA: Diagnosis not present

## 2015-12-18 DIAGNOSIS — M797 Fibromyalgia: Secondary | ICD-10-CM | POA: Diagnosis not present

## 2015-12-18 DIAGNOSIS — G4733 Obstructive sleep apnea (adult) (pediatric): Secondary | ICD-10-CM | POA: Diagnosis not present

## 2015-12-19 ENCOUNTER — Telehealth: Payer: Self-pay | Admitting: Internal Medicine

## 2015-12-19 NOTE — Telephone Encounter (Signed)
I haven't seen her in over 6 months and now under the care of Dr Neldon Mc so best to let him pick what he wants her to have as neither of the choices is a simple substitution and will need close monitoring when then change is made

## 2015-12-19 NOTE — Telephone Encounter (Signed)
Per message Ruthe Mannan is not covered. Covered alternatives are advair and breo. Please advise MW thanks

## 2015-12-19 NOTE — Telephone Encounter (Signed)
LMTCB for pt 

## 2015-12-20 DIAGNOSIS — J449 Chronic obstructive pulmonary disease, unspecified: Secondary | ICD-10-CM | POA: Diagnosis not present

## 2015-12-20 DIAGNOSIS — J439 Emphysema, unspecified: Secondary | ICD-10-CM | POA: Diagnosis not present

## 2015-12-20 DIAGNOSIS — E8801 Alpha-1-antitrypsin deficiency: Secondary | ICD-10-CM | POA: Diagnosis not present

## 2015-12-20 NOTE — Telephone Encounter (Signed)
lmtcb X2 for pt.  

## 2015-12-23 NOTE — Telephone Encounter (Signed)
Called spoke with pt. Made aware of below. Nothing further needed 

## 2015-12-30 DIAGNOSIS — R079 Chest pain, unspecified: Secondary | ICD-10-CM | POA: Diagnosis not present

## 2016-01-01 DIAGNOSIS — Z0100 Encounter for examination of eyes and vision without abnormal findings: Secondary | ICD-10-CM | POA: Diagnosis not present

## 2016-01-07 ENCOUNTER — Encounter: Payer: Self-pay | Admitting: Allergy and Immunology

## 2016-01-07 ENCOUNTER — Ambulatory Visit (INDEPENDENT_AMBULATORY_CARE_PROVIDER_SITE_OTHER): Payer: Medicare HMO | Admitting: Allergy and Immunology

## 2016-01-07 VITALS — BP 120/68 | HR 88 | Resp 16

## 2016-01-07 DIAGNOSIS — K219 Gastro-esophageal reflux disease without esophagitis: Secondary | ICD-10-CM

## 2016-01-07 DIAGNOSIS — J4530 Mild persistent asthma, uncomplicated: Secondary | ICD-10-CM

## 2016-01-07 DIAGNOSIS — E8801 Alpha-1-antitrypsin deficiency: Secondary | ICD-10-CM | POA: Diagnosis not present

## 2016-01-07 DIAGNOSIS — J387 Other diseases of larynx: Secondary | ICD-10-CM | POA: Diagnosis not present

## 2016-01-07 DIAGNOSIS — Z148 Genetic carrier of other disease: Secondary | ICD-10-CM

## 2016-01-07 DIAGNOSIS — D509 Iron deficiency anemia, unspecified: Secondary | ICD-10-CM | POA: Diagnosis not present

## 2016-01-07 DIAGNOSIS — G4733 Obstructive sleep apnea (adult) (pediatric): Secondary | ICD-10-CM | POA: Diagnosis not present

## 2016-01-07 DIAGNOSIS — J309 Allergic rhinitis, unspecified: Secondary | ICD-10-CM | POA: Diagnosis not present

## 2016-01-07 DIAGNOSIS — H1010 Acute atopic conjunctivitis, unspecified eye: Secondary | ICD-10-CM

## 2016-01-07 DIAGNOSIS — R5383 Other fatigue: Secondary | ICD-10-CM | POA: Diagnosis not present

## 2016-01-07 DIAGNOSIS — N959 Unspecified menopausal and perimenopausal disorder: Secondary | ICD-10-CM | POA: Diagnosis not present

## 2016-01-07 NOTE — Patient Instructions (Addendum)
  1. Treat reflux with OTC Ranitidine 150mg  two tablets one time per day  2. Treat inflammation with Dulera 100 two inhalations two times per day  3. Treat inflammation with OTC rhinocort one spray each nostril 3 times per week.   4. Continue benadryl, proair respiclick if needed.   5. Return in 12 weeks or earlier if problem

## 2016-01-07 NOTE — Progress Notes (Signed)
Follow-up Note  Referring Provider: Leighton Ruff, MD Primary Provider: Gerrit Heck, MD Date of Office Visit: 01/07/2016  Subjective:   Michele Jones (DOB: [birth year]) is a 65 y.o. female who returns to the [place] on 01/07/2016 in re-evaluation of the following:  HPI Comments: Michele Jones returns to this clinic in reevaluation of her respiratory problems. I've not seen her in his clinic since 10/15/2015  Michele Jones has had evaluation with a doctor at [facility] regarding her MZ alpha 1 antitrypsinase status. Apparently she had a CT scan of her chest which identified continued interstitial lung disease. Overall her breathing is going relatively well. She does have intermittent bouts of shortness of breath about one or 2 times per week acquiring her to use a short acting bronchodilator. Otherwise she think she is doing okay. She has not required a systemic steroid to treat her lung issue and she's not required an antibiotic since I've last seen her in his clinic. Apparently there has been a discussion with the doctor at [place] [facility] about using azithromycin 3 times per week to treat her condition and possibly taking her off Dulera and exchanging it for a LABA / LAMA combination drug.  Michele Jones has had difficulty with her reflux. In review, she does not want to treat her reflux with any proton pump inhibitor given her history of osteoporosis. Thus, she's not really using any therapy, even the therapy I recommended during her last visit which included ranitidine. She has constant throat clearing and raspy voice and phlegm stuck in her throat and about 2 weeks ago she developed a rather significant obstructive event of her esophagus while eating rice that lasted approximately 20 minutes.  Michele Jones has not been having any significant problems with her upper airways at this point in time.   Current Outpatient Prescriptions on File  Prior to Visit  Medication Sig Dispense Refill  . Albuterol Sulfate 108 (90 BASE) MCG/ACT AEPB Inhale 2 puffs into the lungs every 4 (four) hours as needed.    . Boron 3 MG CAPS Take by mouth daily.    . Calcium-Vitamin A-Vitamin D (LIQUID CALCIUM PO) Take 5 mLs by mouth daily.    [name] FLUZONE HIGH-DOSE 0.5 ML SUSY TO BE ADMINISTERED BY PHARMACIST FOR IMMUNIZATION  0  . glucosamine-chondroitin 500-400 MG tablet Take 1 tablet by mouth 3 (three) times daily.    . Grape Seed 100 MG CAPS Take by mouth 2 (two) times daily.    . mometasone-formoterol (DULERA) 100-5 MCG/ACT AERO Take 2 puffs first thing in am and then another 2 puffs about 12 hours later. 1 Inhaler 11  . Multiple Vitamins-Minerals (MULTIVITAMIN WITH MINERALS) tablet Take 1 tablet by mouth daily.    [name] Leaf Extract 150 MG CAPS Take by mouth daily.    [name] PRASTERONE, DHEA, PO Take 0.5 mg by mouth. Takes one in the morning on mondays wednesdays and fridays    . Quercetin (QUERCITIN) POWD by Does not apply route daily.    . RESTASIS 0.05 % ophthalmic emulsion   0  . thiamine (VITAMIN B-1) 100 MG tablet Take 100 mg by mouth daily.    [name] zolpidem (AMBIEN) 10 MG tablet Take 10 mg by mouth at bedtime as needed for sleep.    [name] azithromycin (ZITHROMAX) 250 MG tablet Take one tablet once daily for ten days. (Patient not taking: Reported on 10/30/2015) 10 tablet 0   No current facility-administered medications on  file prior to visit.    Past Medical History  Diagnosis Date  . Allergy   . Osteoporosis   . Fibromyalgia   . Malaria   . OSA (obstructive sleep apnea)     mild with AHI 5.8/hr now on CPAP at 10cm H2o  . PVC's (premature ventricular contractions)   . PAC (premature atrial contraction)     Past Surgical History  Procedure Laterality Date  . Appendectomy    . Temporomandibular joint surgery      Allergies  Allergen Reactions  . Amoxicillin Rash    Thinks it may cause a rash.   Sarina Ill [Sulfamethoxazole-Trimethoprim]  Rash    Thinks it may cause a rash.   . Doxycycline Nausea Only    Stomach upset  . Augmentin [Amoxicillin-Pot Clavulanate] Rash  . Levaquin [Levofloxacin In D5w] Rash    Review of systems negative except as noted in HPI / PMHx or noted below:  Review of Systems  Constitutional: Negative.   HENT: Negative.   Eyes: Negative.   Respiratory: Negative.   Cardiovascular: Negative.   Gastrointestinal: Negative.   Genitourinary: Negative.   Musculoskeletal: Negative.   Skin: Negative.   Neurological: Negative.   Endo/Heme/Allergies: Negative.   Psychiatric/Behavioral: Negative.      Objective:   Filed Vitals:   01/07/16 1149  BP: 120/68  Pulse: 88  Resp: 16          Physical Exam  Constitutional: She is well-developed, well-nourished, and in no distress.  Raspy voice, throat clearing  HENT:  Head: Normocephalic.  Right Ear: Tympanic membrane, external ear and ear canal normal.  Left Ear: Tympanic membrane, external ear and ear canal normal.  Nose: Nose normal. No mucosal edema or rhinorrhea.  Mouth/Throat: Uvula is midline, oropharynx is clear and moist and mucous membranes are normal. No oropharyngeal exudate.  Eyes: Conjunctivae are normal.  Neck: Trachea normal. No tracheal tenderness present. No tracheal deviation present. No thyromegaly present.  Cardiovascular: Normal rate, regular rhythm, S1 normal, S2 normal and normal heart sounds.   No murmur heard. Pulmonary/Chest: Breath sounds normal. No stridor. No respiratory distress. She has no wheezes. She has no rales.  Musculoskeletal: She exhibits no edema.  Lymphadenopathy:       Head (right side): No tonsillar adenopathy present.       Head (left side): No tonsillar adenopathy present.    She has no cervical adenopathy.    She has no axillary adenopathy.  Neurological: She is alert. Gait normal.  Skin: No rash noted. She is not diaphoretic. No erythema. Nails show no clubbing.  Psychiatric: Mood and affect  normal.    Diagnostics:    Spirometry was performed and demonstrated an FEV1 of 2.15 at 85 % of predicted.  The patient had an Asthma Control Test with the following results: ACT Total Score: 17.    Assessment and Plan:   1. Mild persistent asthma, uncomplicated   2. Allergic rhinoconjunctivitis   3. LPRD (laryngopharyngeal reflux disease)   4. Alpha-1-antitrypsin deficiency carrier ([place])     1. Treat reflux with OTC Ranitidine 150mg  two tablets one time per day  2. Treat inflammation with Dulera 100 two inhalations two times per day  3. Treat inflammation with OTC rhinocort one spray each nostril 3 times per week.   4. Continue benadryl, proair respiclick if needed.   5. Return in 12 weeks or earlier if problem  I think that Michele Jones would do much better if she treated her reflux regarding  some of her respiratory tract symptoms and regarding her esophageal health. I've encouraged her to try the ranitidine as noted above and she'll continue to use to Loera at this point in time and Rhinocort at this point in time for her respiratory tract disease. She'll be following up with the physician from [facility] sometime in the near future regarding whether or not she'll be starting azithromycin and switching over from [facility] to a combination LABA / LAMA. If she does well I will see her back in this clinic in 12 weeks but she can certainly contact me if she has difficulty during the interval. Concerning her MZ phenotype, technically she is a carrier but obviously based on her CT scan results there does appear to be some degree of inflammation that's been present within her lungs whether this is secondary to her MZ phenotype or not his not entirely clear. I suspect that he probably has something to do with her interstitial changes within her lung.  Allena Katz, MD [place]

## 2016-01-10 DIAGNOSIS — G4733 Obstructive sleep apnea (adult) (pediatric): Secondary | ICD-10-CM | POA: Diagnosis not present

## 2016-01-16 ENCOUNTER — Telehealth: Payer: Self-pay

## 2016-01-16 NOTE — Telephone Encounter (Signed)
Dr Kozlow please advise 

## 2016-01-16 NOTE — Telephone Encounter (Signed)
Patient called to see how long does Dr. Neldon Mc want her to stay on ranitidine for reflux, and also is there a certain time of day patient should take this medication.   Thanks   Please Advise

## 2016-01-16 NOTE — Telephone Encounter (Signed)
Please inform patient that she should stay on ranitidine until her return visit. Can take at night.

## 2016-01-16 NOTE — Telephone Encounter (Signed)
Called and informed patient. 

## 2016-01-17 ENCOUNTER — Telehealth: Payer: Self-pay

## 2016-01-17 NOTE — Telephone Encounter (Signed)
Patient called on yesterday about zantac. She tried to take the zantac @night  last night and she had insomnia and couldn't sleep. Today she is feeling a little weird she doesn't know if its the meds or her IBS. She first tried it during the day time and seem to work a lil better for her.  Please Advise  Thanks

## 2016-01-17 NOTE — Telephone Encounter (Signed)
Spoke to patient and advised to take in the morning like she was doing before and if any problem to contact us back.

## 2016-01-22 DIAGNOSIS — J069 Acute upper respiratory infection, unspecified: Secondary | ICD-10-CM | POA: Diagnosis not present

## 2016-01-27 DIAGNOSIS — H903 Sensorineural hearing loss, bilateral: Secondary | ICD-10-CM | POA: Diagnosis not present

## 2016-01-28 ENCOUNTER — Telehealth: Payer: Self-pay

## 2016-01-28 NOTE — Telephone Encounter (Signed)
Patient given message from Dr. Neldon Mc that she can try Pepcid instead of Zantac

## 2016-01-28 NOTE — Telephone Encounter (Signed)
Has had dizziness since February 18th Went to [place] after hours clinic Was put on Z pack for sinusitis Was instructed to follow up with her ENT which she is going to make an appointment with Feels nauseated while taking generic zantac for reflux so is going to hold for now. Concerned about following up with too many doctors but is going to follow up with ENT for dizziness

## 2016-01-28 NOTE — Telephone Encounter (Signed)
Patient called, and would like to talk to Dr. Bruna Potter Nurse. She is having some issues with her sinuses and dizzy spells. She also thinks one of the medications Dr. Neldon Mc put her on for acid reflux is causing nausea.  Please Advise  Thanks

## 2016-01-28 NOTE — Telephone Encounter (Signed)
Please inform patient that she can try famotidine 10mg  one time per day instead of ranitidine once she feels better.

## 2016-01-31 DIAGNOSIS — H8110 Benign paroxysmal vertigo, unspecified ear: Secondary | ICD-10-CM | POA: Diagnosis not present

## 2016-02-11 DIAGNOSIS — Z9989 Dependence on other enabling machines and devices: Secondary | ICD-10-CM | POA: Diagnosis not present

## 2016-02-11 DIAGNOSIS — G4733 Obstructive sleep apnea (adult) (pediatric): Secondary | ICD-10-CM | POA: Diagnosis not present

## 2016-02-11 DIAGNOSIS — J439 Emphysema, unspecified: Secondary | ICD-10-CM | POA: Diagnosis not present

## 2016-02-11 DIAGNOSIS — H8110 Benign paroxysmal vertigo, unspecified ear: Secondary | ICD-10-CM | POA: Insufficient documentation

## 2016-02-11 DIAGNOSIS — R42 Dizziness and giddiness: Secondary | ICD-10-CM | POA: Diagnosis not present

## 2016-04-13 DIAGNOSIS — G4733 Obstructive sleep apnea (adult) (pediatric): Secondary | ICD-10-CM | POA: Diagnosis not present

## 2016-04-14 ENCOUNTER — Ambulatory Visit (INDEPENDENT_AMBULATORY_CARE_PROVIDER_SITE_OTHER): Payer: Medicare HMO | Admitting: Allergy and Immunology

## 2016-04-14 VITALS — BP 132/68 | HR 80 | Resp 16

## 2016-04-14 DIAGNOSIS — E8801 Alpha-1-antitrypsin deficiency: Secondary | ICD-10-CM

## 2016-04-14 DIAGNOSIS — J849 Interstitial pulmonary disease, unspecified: Secondary | ICD-10-CM

## 2016-04-14 DIAGNOSIS — J4540 Moderate persistent asthma, uncomplicated: Secondary | ICD-10-CM | POA: Diagnosis not present

## 2016-04-14 DIAGNOSIS — J309 Allergic rhinitis, unspecified: Secondary | ICD-10-CM | POA: Diagnosis not present

## 2016-04-14 DIAGNOSIS — K219 Gastro-esophageal reflux disease without esophagitis: Secondary | ICD-10-CM

## 2016-04-14 DIAGNOSIS — J387 Other diseases of larynx: Secondary | ICD-10-CM | POA: Diagnosis not present

## 2016-04-14 DIAGNOSIS — H1010 Acute atopic conjunctivitis, unspecified eye: Secondary | ICD-10-CM | POA: Diagnosis not present

## 2016-04-14 DIAGNOSIS — M810 Age-related osteoporosis without current pathological fracture: Secondary | ICD-10-CM

## 2016-04-14 DIAGNOSIS — Z148 Genetic carrier of other disease: Secondary | ICD-10-CM

## 2016-04-14 NOTE — Patient Instructions (Addendum)
  1. Treat reflux with OTC Ranitidine 150mg  two tablets one time per day  2. Treat inflammation with Anoro and azithromycin as prescribed by [facility] doctor.  3. Treat inflammation with OTC rhinocort one spray each nostril 1-3 times per week.   4. Continue benadryl, proair respiclick if needed.   5. Return in 12 weeks or earlier if problem

## 2016-04-14 NOTE — Progress Notes (Signed)
Follow-up Note  Referring Provider: Leighton Ruff, MD Primary Provider: Gerrit Heck, MD Date of Office Visit: 04/14/2016  Subjective:   Michele Jones (DOB: 04/11/1951) is a 65 y.o. 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  She thinks that her breathing is doing okay. Okay for Rena means that she always has some degree of congestion in her chest and my have a slight cough and uses her bronchodilator about 2 times a week. She thinks that her breathing is much worse while locating in the [place] area then it is when she visited [place] and visited [place] recently. She's been changed from [facility] to [facility] and placed on azithromycin 3 times per week by her pulmonologist located in [place]. She has not required a systemic steroid to treat her condition.  Her nose is doing relatively well although she did have a nosebleed on one occasion while using her Rhinocort 3 times a week. Otherwise, she has not had any episodes of sinusitis since I've last seen her in his clinic.  Her reflux is intermittently active. She still has lots of throat clearing and intermittent raspy voice and postnasal drip. She does not use any daily treatment for her reflux. Occasionally she'll take a Pepcid and occasionally she will take a Tums.  She is using some type of medication she received from [place] to treat her osteoporosis. She has an appointment to see Dr. Forde Dandy about this issue sometime in the next several months.    Medication List           ANORO ELLIPTA 62.5-25 MCG/INH Aepb  Generic drug:  umeclidinium-vilanterol  Inhale 1 Dose into the lungs daily.     azithromycin 500 MG tablet    Commonly known as:  ZITHROMAX  Take 1 tablet by mouth 3 (three) times a week.     Boron 3 MG Caps  Take by mouth daily.     glucosamine-chondroitin 500-400 MG tablet  Take 1 tablet by mouth 3 (three) times daily.     Grape Seed 100 MG Caps  Take by mouth 2 (two) times daily.     LIQUID CALCIUM PO  Take 5 mLs by mouth daily.     multivitamin with minerals tablet  Take 1 tablet by mouth daily.     Olive Leaf Extract 150 MG Caps  Take by mouth daily.     PRASTERONE (DHEA) PO  Take 0.5 mg by mouth. Takes one in the morning on mondays wednesdays and fridays     PROAIR RESPICLICK 123XX123 (90 Base) MCG/ACT Aepb  Generic drug:  Albuterol Sulfate  Inhale 2 Doses into the lungs every 4 (four) hours as needed.     Quercitin Powd  by Does not apply route daily.     RESTASIS 0.05 % ophthalmic emulsion  Generic drug:  cycloSPORINE     thiamine 100 MG tablet  Commonly known as:  VITAMIN B-1  Take 100 mg by mouth daily.     zolpidem 10 MG tablet  Commonly known as:  AMBIEN  Take 10 mg by mouth at bedtime as needed for sleep.        Past Medical History  Diagnosis Date  . Allergy   . Osteoporosis   . Fibromyalgia   .  Malaria   . OSA (obstructive sleep apnea)     mild with AHI 5.8/hr now on CPAP at 10cm H2o  . PVC's (premature ventricular contractions)   . PAC (premature atrial contraction)     Past Surgical History  Procedure Laterality Date  . Appendectomy    . Temporomandibular joint surgery      Allergies  Allergen Reactions  . Amoxicillin Rash    Thinks it may cause a rash.   Sarina Ill [Sulfamethoxazole-Trimethoprim] Rash    Thinks it may cause a rash.   . Augmentin [Amoxicillin-Pot Clavulanate] Rash  . Levaquin [Levofloxacin In D5w] Rash    Review of systems negative except as noted in HPI / PMHx or noted below:  Review of Systems  Constitutional: Negative.   HENT: Negative.   Eyes: Negative.   Respiratory: Negative.   Cardiovascular: Negative.    Gastrointestinal: Negative.   Genitourinary: Negative.   Musculoskeletal: Negative.   Skin: Negative.   Neurological: Negative.   Endo/Heme/Allergies: Negative.   Psychiatric/Behavioral: Negative.      Objective:   Filed Vitals:   04/14/16 1153  BP: 132/68  Pulse: 80  Resp: 16          Physical Exam  Constitutional: She is well-developed, well-nourished, and in no distress.  Raspy voice, throat clearing  HENT:  Head: Normocephalic.  Right Ear: Tympanic membrane, external ear and ear canal normal.  Left Ear: Tympanic membrane, external ear and ear canal normal.  Nose: Nose normal. No mucosal edema or rhinorrhea.  Mouth/Throat: Uvula is midline, oropharynx is clear and moist and mucous membranes are normal. No oropharyngeal exudate.  Eyes: Conjunctivae are normal.  Neck: Trachea normal. No tracheal tenderness present. No tracheal deviation present. No thyromegaly present.  Cardiovascular: Normal rate, regular rhythm, S1 normal, S2 normal and normal heart sounds.   No murmur heard. Pulmonary/Chest: Breath sounds normal. No stridor. No respiratory distress. She has no wheezes. She has no rales.  Musculoskeletal: She exhibits no edema.  Lymphadenopathy:       Head (right side): No tonsillar adenopathy present.       Head (left side): No tonsillar adenopathy present.    She has no cervical adenopathy.  Neurological: She is alert. Gait normal.  Skin: No rash noted. She is not diaphoretic. No erythema. Nails show no clubbing.  Psychiatric: Mood and affect normal.    Diagnostics:    Spirometry was performed and demonstrated an FEV1 of 1.38 at 52 % of predicted.  The patient had an Asthma Control Test with the following results: ACT Total Score: 21.    Assessment and Plan:   1. Alpha-1-antitrypsin deficiency carrier ([place])   2. Interstitial lung disease ([place])   3. Asthma, moderate persistent, well-controlled   4. Allergic rhinoconjunctivitis   5. LPRD  (laryngopharyngeal reflux disease)   6. Osteoporosis     1. Treat reflux with OTC Ranitidine 150mg  two tablets one time per day  2. Treat inflammation with Anoro and azithromycin as prescribed by [facility] doctor.  3. Treat inflammation with OTC rhinocort one spray each nostril 1-3 times per week.   4. Continue benadryl, proair respiclick if needed.   5. Return in 12 weeks or earlier if problem  Mearl Latin appears to be doing relatively well at this point in time regarding her respiratory tract disease and she will continue to use the medical therapy as prescribed by her [place] pulmonologist and we'll have her use a little bit less Rhinocort given the fact that she  did have a nosebleed using this agent  3 times per week. I once again had a long talk with her today about treating some of her untreated medical conditions including her LPR and her osteoporosis. I made a recommendation that she uses ranitidine on a consistent basis and she can discuss with Dr. Forde Dandy about treating osteoporosis. I had extensive discussions with her in the past about this issue and she has great fear about the medications associated with treating osteoporosis. She did mention to me today that she thought Dr. Forde Dandy was going to repeat a bone density scan and I asked her to answer one question before she goes ahead and get set scan. The question would be how are the results of that scan going to change her treatment because I suspect that regardless of the results of that scan she will not use any treatment for osteoporosis except the medication that she received from [place]. I will see her back in this clinic in 12 weeks or earlier if there is a problem.  Allena Katz, MD [place]

## 2016-04-15 ENCOUNTER — Encounter: Payer: Self-pay | Admitting: Allergy and Immunology

## 2016-04-23 ENCOUNTER — Ambulatory Visit (INDEPENDENT_AMBULATORY_CARE_PROVIDER_SITE_OTHER): Payer: Medicare HMO | Admitting: Cardiology

## 2016-04-23 ENCOUNTER — Encounter: Payer: Self-pay | Admitting: Cardiology

## 2016-04-23 VITALS — BP 135/60 | HR 81 | Ht 67.0 in | Wt 121.4 lb

## 2016-04-23 DIAGNOSIS — I493 Ventricular premature depolarization: Secondary | ICD-10-CM

## 2016-04-23 DIAGNOSIS — R0602 Shortness of breath: Secondary | ICD-10-CM | POA: Diagnosis not present

## 2016-04-23 DIAGNOSIS — I491 Atrial premature depolarization: Secondary | ICD-10-CM

## 2016-04-23 DIAGNOSIS — G4733 Obstructive sleep apnea (adult) (pediatric): Secondary | ICD-10-CM

## 2016-04-23 NOTE — Patient Instructions (Signed)

## 2016-04-23 NOTE — Progress Notes (Addendum)
Cardiology Office Note    Date:  04/23/2016   ID:  Evaly Ginger, DOB 1951-04-30, MRN [id number]  PCP:  Gennette Pac, MD  Cardiologist:  Fransico Him, MD   Chief Complaint  Patient presents with  . Sleep Apnea  . Irregular Heart Beat    History of Present Illness:  Michele Jones is a 65 y.o. female with a history of symptomatic PVC's and PAC's, mild OSA on CPAP and diastolic dysfunction who presents today for followup. She is doing well with her CPAP. She tolerates her nasal pillow mask and feels the pressure is adequate. She feels more rested in the am but does have some daytime sleepiness.  She does not snore. She has alpha trypsin 1 def and COPD with bronchiectasis and has chronic SOB is followed at a clinic in [facility] by Pulmonology.  She denies any chest pain or syncope. She will notice palpitations when she has a cold or with coffee. She says that the palpitations are now less frequent. She occasionally has some mild LE edema.     Past Medical History  Diagnosis Date  . Allergy   . Osteoporosis   . Fibromyalgia   . Malaria   . OSA (obstructive sleep apnea)     mild with AHI 5.8/hr now on CPAP at 10cm H2o  . PVC's (premature ventricular contractions)   . PAC (premature atrial contraction)   . Bronchiectasis ([place])   . COPD (chronic obstructive pulmonary disease) (HCC)     alpha 1 antitrypsin deficiency    Past Surgical History  Procedure Laterality Date  . Appendectomy    . Temporomandibular joint surgery      Current Medications: Outpatient Prescriptions Prior to Visit  Medication Sig Dispense Refill  . Albuterol Sulfate (PROAIR RESPICLICK) 123XX123 (90 Base) MCG/ACT AEPB Inhale 2 Doses into the lungs every 4 (four) hours as needed.    Jearl Klinefelter ELLIPTA 62.5-25 MCG/INH AEPB Inhale 1 Dose into the lungs daily.  11  . azithromycin (ZITHROMAX) 500 MG tablet Take 1 tablet by mouth 3 (three) times a week.  3  . Boron 3 MG CAPS Take by mouth daily.    .  Calcium-Vitamin A-Vitamin D (LIQUID CALCIUM PO) Take 5 mLs by mouth daily.    [name] glucosamine-chondroitin 500-400 MG tablet Take 1 tablet by mouth 3 (three) times daily.    . Grape Seed 100 MG CAPS Take by mouth 2 (two) times daily.    . Multiple Vitamins-Minerals (MULTIVITAMIN WITH MINERALS) tablet Take 1 tablet by mouth daily.    [name] Leaf Extract 150 MG CAPS Take by mouth daily.    [name] PRASTERONE, DHEA, PO Take 0.5 mg by mouth. Takes one in the morning on mondays wednesdays and fridays    . Quercetin (QUERCITIN) POWD by Does not apply route daily.    . RESTASIS 0.05 % ophthalmic emulsion   0  . thiamine (VITAMIN B-1) 100 MG tablet Take 100 mg by mouth daily.    [name] zolpidem (AMBIEN) 10 MG tablet Take 10 mg by mouth at bedtime as needed for sleep.     No facility-administered medications prior to visit.     Allergies:   Amoxicillin; Septra; Augmentin; and Levaquin   Social History   Social History  . Marital Status: Married    Spouse Name: N/A  . Number of Children: N/A  . Years of Education: N/A   Social History Main Topics  . Smoking status: Never Smoker   . Smokeless tobacco: Never  Used  . Alcohol Use: No  . Drug Use: No  . Sexual Activity: Not Asked   Other Topics Concern  . None   Social History Narrative     Family History:  The patient's family history includes Cancer in her father and maternal grandmother; Diabetes in her paternal grandmother; Hypertension in her brother; Stroke in her mother.   ROS:   Please see the history of present illness.    ROS All other systems reviewed and are negative.   PHYSICAL EXAM:   VS:  BP 135/60 mmHg  Pulse 81  Ht 5\' 7"  (1.702 m)  Wt 121 lb 6.4 oz (55.067 kg)  BMI 19.01 kg/m2   GEN: Well nourished, well developed, in no acute distress  HEENT: normal  Neck: no JVD, carotid bruits, or masses Cardiac: RRR; no murmurs, rubs, or gallops,no edema.  Intact distal pulses bilaterally.  Respiratory:  clear to auscultation  bilaterally, normal work of breathing GI: soft, nontender, nondistended, + BS MS: no deformity or atrophy  Skin: warm and dry, no rash Neuro:  Alert and Oriented x 3, Strength and sensation are intact Psych: euthymic mood, full affect  Wt Readings from Last 3 Encounters:  04/23/16 121 lb 6.4 oz (55.067 kg)  04/16/15 118 lb (53.524 kg)  04/10/15 120 lb (54.432 kg)      Studies/Labs Reviewed:   EKG:  EKG is not ordered today.    Recent Labs: No results found for requested labs within last 365 days.   Lipid Panel No results found for: CHOL, TRIG, HDL, CHOLHDL, VLDL, LDLCALC, LDLDIRECT  Additional studies/ records that were reviewed today include:  none    ASSESSMENT:    1. PAC (premature atrial contraction)   2. PVC's (premature ventricular contractions)   3. OSA (obstructive sleep apnea)   4. SOB (shortness of breath)      PLAN:  In order of problems listed above:  1.  PACs - well tolerated on no meds 2.  PVCs - well tolerated on no meds 3.  OSA - the patient is tolerating PAP therapy well without any problems. The PAP download was reviewed today and showed an AHI of 0.2/hr on 10 cm H2O with 90% compliance in using more than 4 hours nightly.  The patient has been using and benefiting from CPAP use and will continue to benefit from therapy.  4.  SOB secondary to COPD/bronchiectasis.  She has not had an echo in several years.  I will get a 2D echo to reassess.     Medication Adjustments/Labs and Tests Ordered: Current medicines are reviewed at length with the patient today.  Concerns regarding medicines are outlined above.  Medication changes, Labs and Tests ordered today are listed in the Patient Instructions below.  There are no Patient Instructions on file for this visit.   Signed, Fransico Him, MD  04/23/2016 11:49 AM    [address], [place], [place]  [address] Phone: [phone]; Fax: [phone]

## 2016-04-30 ENCOUNTER — Encounter: Payer: Self-pay | Admitting: Cardiology

## 2016-05-11 ENCOUNTER — Ambulatory Visit (HOSPITAL_COMMUNITY): Payer: Medicare HMO | Attending: Cardiovascular Disease

## 2016-05-11 ENCOUNTER — Other Ambulatory Visit: Payer: Self-pay

## 2016-05-11 DIAGNOSIS — G4733 Obstructive sleep apnea (adult) (pediatric): Secondary | ICD-10-CM | POA: Insufficient documentation

## 2016-05-11 DIAGNOSIS — R0602 Shortness of breath: Secondary | ICD-10-CM | POA: Diagnosis not present

## 2016-05-13 DIAGNOSIS — R5382 Chronic fatigue, unspecified: Secondary | ICD-10-CM | POA: Diagnosis not present

## 2016-05-13 DIAGNOSIS — J984 Other disorders of lung: Secondary | ICD-10-CM | POA: Diagnosis not present

## 2016-05-13 DIAGNOSIS — J449 Chronic obstructive pulmonary disease, unspecified: Secondary | ICD-10-CM | POA: Diagnosis not present

## 2016-05-13 DIAGNOSIS — E8801 Alpha-1-antitrypsin deficiency: Secondary | ICD-10-CM | POA: Diagnosis not present

## 2016-05-13 DIAGNOSIS — R932 Abnormal findings on diagnostic imaging of liver and biliary tract: Secondary | ICD-10-CM | POA: Diagnosis not present

## 2016-05-13 DIAGNOSIS — G4733 Obstructive sleep apnea (adult) (pediatric): Secondary | ICD-10-CM | POA: Diagnosis not present

## 2016-05-13 DIAGNOSIS — K5900 Constipation, unspecified: Secondary | ICD-10-CM | POA: Diagnosis not present

## 2016-05-15 ENCOUNTER — Other Ambulatory Visit (HOSPITAL_COMMUNITY): Payer: Self-pay | Admitting: Internal Medicine

## 2016-05-15 DIAGNOSIS — R932 Abnormal findings on diagnostic imaging of liver and biliary tract: Secondary | ICD-10-CM

## 2016-06-10 ENCOUNTER — Ambulatory Visit (HOSPITAL_COMMUNITY)
Admission: RE | Admit: 2016-06-10 | Discharge: 2016-06-10 | Disposition: A | Discharge status: Home / Self Care | Payer: Medicare HMO | Source: Ambulatory Visit | Attending: Internal Medicine | Admitting: Internal Medicine

## 2016-06-10 DIAGNOSIS — R932 Abnormal findings on diagnostic imaging of liver and biliary tract: Secondary | ICD-10-CM | POA: Insufficient documentation

## 2016-07-14 ENCOUNTER — Encounter: Payer: Medicare HMO | Admitting: Allergy and Immunology

## 2016-07-16 DIAGNOSIS — G4733 Obstructive sleep apnea (adult) (pediatric): Secondary | ICD-10-CM | POA: Diagnosis not present

## 2016-07-30 DIAGNOSIS — N951 Menopausal and female climacteric states: Secondary | ICD-10-CM | POA: Diagnosis not present

## 2016-07-30 DIAGNOSIS — R5381 Other malaise: Secondary | ICD-10-CM | POA: Diagnosis not present

## 2016-08-09 DIAGNOSIS — Z23 Encounter for immunization: Secondary | ICD-10-CM | POA: Diagnosis not present

## 2016-08-12 DIAGNOSIS — Z01419 Encounter for gynecological examination (general) (routine) without abnormal findings: Secondary | ICD-10-CM | POA: Diagnosis not present

## 2016-08-12 DIAGNOSIS — Z1231 Encounter for screening mammogram for malignant neoplasm of breast: Secondary | ICD-10-CM | POA: Diagnosis not present

## 2016-08-31 DIAGNOSIS — J3089 Other allergic rhinitis: Secondary | ICD-10-CM | POA: Diagnosis not present

## 2016-08-31 DIAGNOSIS — M26623 Arthralgia of bilateral temporomandibular joint: Secondary | ICD-10-CM | POA: Diagnosis not present

## 2016-08-31 DIAGNOSIS — R040 Epistaxis: Secondary | ICD-10-CM | POA: Diagnosis not present

## 2016-08-31 DIAGNOSIS — H8110 Benign paroxysmal vertigo, unspecified ear: Secondary | ICD-10-CM | POA: Diagnosis not present

## 2016-08-31 DIAGNOSIS — H9113 Presbycusis, bilateral: Secondary | ICD-10-CM | POA: Diagnosis not present

## 2016-08-31 DIAGNOSIS — R42 Dizziness and giddiness: Secondary | ICD-10-CM | POA: Diagnosis not present

## 2016-09-30 DIAGNOSIS — K6289 Other specified diseases of anus and rectum: Secondary | ICD-10-CM | POA: Diagnosis not present

## 2016-09-30 DIAGNOSIS — R151 Fecal smearing: Secondary | ICD-10-CM | POA: Diagnosis not present

## 2016-10-14 DIAGNOSIS — M797 Fibromyalgia: Secondary | ICD-10-CM | POA: Diagnosis not present

## 2016-10-14 DIAGNOSIS — J441 Chronic obstructive pulmonary disease with (acute) exacerbation: Secondary | ICD-10-CM | POA: Diagnosis not present

## 2016-10-14 DIAGNOSIS — M810 Age-related osteoporosis without current pathological fracture: Secondary | ICD-10-CM | POA: Diagnosis not present

## 2016-10-14 DIAGNOSIS — D509 Iron deficiency anemia, unspecified: Secondary | ICD-10-CM | POA: Diagnosis not present

## 2016-10-14 DIAGNOSIS — N951 Menopausal and female climacteric states: Secondary | ICD-10-CM | POA: Diagnosis not present

## 2016-10-20 DIAGNOSIS — G4733 Obstructive sleep apnea (adult) (pediatric): Secondary | ICD-10-CM | POA: Diagnosis not present

## 2016-11-18 NOTE — Progress Notes (Signed)
Cardiology Office Note    Date:  11/19/2016   ID:  Michele Jones, DOB 1951-11-21, MRN [id number]  PCP:  Gennette Pac, MD  Cardiologist:  Dr. Radford Pax  Chief Complaint: Blood pressure and palpitations  History of Present Illness:   Michele Jones is a 65 y.o. female with history of symptomatic PVC's and PAC's, mild OSA on CPAP and diastolic dysfunction who presents today for   She has alpha trypsin 1 def and COPD with bronchiectasis and has chronic SOB is followed at a clinic in [facility] by Pulmonology.  She denies any chest pain or syncope. She will notice palpitations when she has a cold or with coffee.  Myoview 11/2013 was normal. Monitor Jan/Feb 2015 showed NSR with PACs and PVCs, no arrhthymias. She was doing well when last seen by Dr. Radford Pax 03/2016. Last echo 04/2016 showed normal LV function, no WM abnormality.    The patient had episode of palpitation in beginning of this month. Similar to prior episode. She was sick at that time. However for the past week she had "different kind" of palpitation that she described as "thumb printing". Last for 20-30 minutes and associated with dyspnea, fatigue and tiredness. Also complains of intermittent chest pain. Describes as sharp. Recently her bp running high up to 170s/80 with as low as 100/50s. She is unable to tell if her blood pressure fluctuation associated with palpitation or chest pain.  Denies orthopnea, PND, syncope, LE edema, headache, dizziness.    Past Medical History:  Diagnosis Date  . Allergy   . Bronchiectasis ([place])   . COPD (chronic obstructive pulmonary disease) (HCC)    alpha 1 antitrypsin deficiency  . Fibromyalgia   . Malaria   . OSA (obstructive sleep apnea)    mild with AHI 5.8/hr now on CPAP at 10cm H2o  . Osteoporosis   . PAC (premature atrial contraction)   . PVC's (premature ventricular contractions)     Past Surgical History:  Procedure Laterality Date  . APPENDECTOMY    . TEMPOROMANDIBULAR  JOINT SURGERY      Current Medications: Prior to Admission medications   Medication Sig Start Date End Date Taking? Authorizing Provider  Albuterol Sulfate (PROAIR RESPICLICK) 123XX123 (90 Base) MCG/ACT AEPB Inhale 2 Doses into the lungs every 4 (four) hours as needed.    Historical Provider, MD  ANORO ELLIPTA 62.5-25 MCG/INH AEPB Inhale 1 Dose into the lungs daily. 03/04/16   Historical Provider, MD  azithromycin (ZITHROMAX) 500 MG tablet Take 1 tablet by mouth 3 (three) times a week. 03/02/16   Historical Provider, MD  Boron 3 MG CAPS Take by mouth daily.    Historical Provider, MD  Calcium-Vitamin A-Vitamin D (LIQUID CALCIUM PO) Take 5 mLs by mouth daily.    Historical Provider, MD  glucosamine-chondroitin 500-400 MG tablet Take 1 tablet by mouth 3 (three) times daily.    Historical Provider, MD  Grape Seed 100 MG CAPS Take by mouth 2 (two) times daily.    Historical Provider, MD  Multiple Vitamins-Minerals (MULTIVITAMIN WITH MINERALS) tablet Take 1 tablet by mouth daily.    Historical Provider, MD  Olive Leaf Extract 150 MG CAPS Take by mouth daily.    Historical Provider, MD  PRASTERONE, DHEA, PO Take 0.5 mg by mouth. Takes one in the morning on mondays wednesdays and [place] Provider, MD  Quercetin (QUERCITIN) POWD by Does not apply route daily.    Historical Provider, MD  RESTASIS 0.05 % ophthalmic emulsion  08/22/14  Historical Provider, MD  thiamine (VITAMIN B-1) 100 MG tablet Take 100 mg by mouth daily.    Historical Provider, MD  zolpidem (AMBIEN) 10 MG tablet Take 10 mg by mouth at bedtime as needed for sleep.    Historical Provider, MD    Allergies:   Amoxicillin; Septra [sulfamethoxazole-trimethoprim]; Augmentin [amoxicillin-pot clavulanate]; and Levaquin [levofloxacin in d5w]   Social History   Social History  . Marital status: Married    Spouse name: N/A  . Number of children: N/A  . Years of education: N/A   Social History Main Topics  . Smoking status: Never  Smoker  . Smokeless tobacco: Never Used  . Alcohol use No  . Drug use: No  . Sexual activity: Not Asked   Other Topics Concern  . None   Social History Narrative  . None     Family History:  The patient's family history includes Cancer in her father and maternal grandmother; Diabetes in her paternal grandmother; Hypertension in her brother; Stroke in her mother.   ROS:   Please see the history of present illness.    ROS All other systems reviewed and are negative.   PHYSICAL EXAM:   VS:  BP 116/64   Pulse 68   Resp 18   Wt 119 lb (54 kg)   SpO2 98%   BMI 18.64 kg/m    GEN: Well nourished, well developed, in no acute distress  HEENT: normal  Neck: no JVD, carotid bruits, or masses Cardiac: RRR; no murmurs, rubs, or gallops,no edema  Respiratory:  clear to auscultation bilaterally, normal work of breathing GI: soft, nontender, nondistended, + BS MS: no deformity or atrophy  Skin: warm and dry, no rash Neuro:  Alert and Oriented x 3, Strength and sensation are intact Psych: euthymic mood, full affect  Wt Readings from Last 3 Encounters:  11/19/16 119 lb (54 kg)  04/23/16 121 lb 6.4 oz (55.1 kg)  04/16/15 118 lb (53.5 kg)      Studies/Labs Reviewed:   EKG:  EKG is ordered today.  The ekg ordered today demonstrates NSR with new RBBB.   Recent Labs: No results found for requested labs within last 8760 hours.   Lipid Panel No results found for: CHOL, TRIG, HDL, CHOLHDL, VLDL, LDLCALC, LDLDIRECT  Additional studies/ records that were reviewed today include:   Echocardiogram: 05/11/16 LV EF: 55% -   60%  ------------------------------------------------------------------- Indications:      Shortness of Breath (R06.02).  ------------------------------------------------------------------- History:   PMH:  Obstructive Sleep Apnea, PVC&'s/PAC&'s, Palpitations, Edema, Alpha-1-Antitrypsin Deficiency, Interstitial Lung Disease.  Chronic obstructive pulmonary  disease.  ------------------------------------------------------------------- Study Conclusions  - Left ventricle: The cavity size was normal. Systolic function was   normal. The estimated ejection fraction was in the range of 55%   to 60%. Wall motion was normal; there were no regional wall   motion abnormalities. Left ventricular diastolic function   parameters were normal.  Myoview: 12/19/13 Impression Exercise Capacity:  Good exercise capacity. BP Response:  Normal blood pressure response. Clinical Symptoms:  There is dyspnea. ECG Impression:  Low specificity ST segment depression with ECG abnormal at baseline. Comparison with Prior Nuclear Study: No previous nuclear study performed  Overall Impression:  Low risk stress nuclear study with equivocal ECG changes and no evidence of ischemia on nuclear imaging..  ASSESSMENT & PLAN:    1. PACs and PVCs - Recently different palpitations lasing 20-30 minutes that associated with dyspnea, fatigue and tiredness. Will get 30 days monitor. PRN  Cardizem. No syncope. She will call us if worsening symptoms.   2. Chest pain - Seem atypical however given extreme fatigue and new RBBB will get Myoview.   3. OSA on CPAP - Compliant with CPAP. However since recent mask change her palpitation and blood fluctuation has arise. Difficult to find which one is causing which. Advised to continue to log of her blood pressure.   4. Chronic dyspnea 2nd to COPD/Bronchiectasis - Followed by pulmonary. Echo 6/17 showed normal EF.     Medication Adjustments/Labs and Tests Ordered: Current medicines are reviewed at length with the patient today.  Concerns regarding medicines are outlined above.  Medication changes, Labs and Tests ordered today are listed in the Patient Instructions below. Patient Instructions  Medication Instructions:  Your physician has recommended you make the following change in your medication:  START Cardizem 30mg  daily as needed  for palpitations. An Rx has been sent to your pharmacy.  Labwork: None ordered  Testing/Procedures: Your physician has recommended that you wear an event monitor. Event monitors are medical devices that record the heart's electrical activity. Doctors most often [place] these monitors to diagnose arrhythmias. Arrhythmias are problems with the speed or rhythm of the heartbeat. The monitor is a small, portable device. You can wear one while you do your normal daily activities. This is usually used to diagnose what is causing palpitations/syncope (passing out).  Your physician has requested that you have en exercise stress myoview. For further information please visit HugeFiesta.[email or web address]. Please follow instruction sheet, as given.    Follow-Up: Your physician recommends that you schedule a follow-up appointment in: 4-6 weeks with an APP a day that Dr.Turner is in the office   Any Other Special Instructions Will Be Listed Below (If Applicable).     If you need a refill on your cardiac medications before your next appointment, please call your pharmacy.      Jarrett Soho, [place]  11/19/2016 10:04 AM    [facility] [address], [place], [place]  [address] Phone: [phone]; Fax: [phone]

## 2016-11-19 ENCOUNTER — Encounter: Payer: Self-pay | Admitting: Physician Assistant

## 2016-11-19 ENCOUNTER — Encounter: Payer: Self-pay | Admitting: *Deleted

## 2016-11-19 ENCOUNTER — Ambulatory Visit (INDEPENDENT_AMBULATORY_CARE_PROVIDER_SITE_OTHER): Payer: Medicare HMO | Admitting: Physician Assistant

## 2016-11-19 VITALS — BP 116/64 | HR 68 | Resp 18 | Wt 119.0 lb

## 2016-11-19 DIAGNOSIS — Z9989 Dependence on other enabling machines and devices: Secondary | ICD-10-CM

## 2016-11-19 DIAGNOSIS — G4733 Obstructive sleep apnea (adult) (pediatric): Secondary | ICD-10-CM | POA: Diagnosis not present

## 2016-11-19 DIAGNOSIS — R5383 Other fatigue: Secondary | ICD-10-CM | POA: Diagnosis not present

## 2016-11-19 DIAGNOSIS — R0789 Other chest pain: Secondary | ICD-10-CM

## 2016-11-19 DIAGNOSIS — R002 Palpitations: Secondary | ICD-10-CM

## 2016-11-19 DIAGNOSIS — I998 Other disorder of circulatory system: Secondary | ICD-10-CM

## 2016-11-19 DIAGNOSIS — J439 Emphysema, unspecified: Secondary | ICD-10-CM

## 2016-11-19 MED ORDER — DILTIAZEM HCL 30 MG PO TABS
30.0000 mg | ORAL_TABLET | Freq: Every day | ORAL | 5 refills | Status: DC | PRN
Start: 2016-11-19 — End: 2017-11-17

## 2016-11-19 NOTE — Progress Notes (Signed)
Patient ID: Michele Jones, female   DOB: 1951-08-31, 65 y.o.   MRN: [id number] Patient enrolled for [employer] to mail a cardiac event monitor to her home.  Request for sensitive skin electrodes was specified.

## 2016-11-19 NOTE — Patient Instructions (Signed)
Medication Instructions:  Your physician has recommended you make the following change in your medication:  START Cardizem 30mg  daily as needed for palpitations. An Rx has been sent to your pharmacy.  Labwork: None ordered  Testing/Procedures: Your physician has recommended that you wear an event monitor. Event monitors are medical devices that record the heart's electrical activity. Doctors most often [place] these monitors to diagnose arrhythmias. Arrhythmias are problems with the speed or rhythm of the heartbeat. The monitor is a small, portable device. You can wear one while you do your normal daily activities. This is usually used to diagnose what is causing palpitations/syncope (passing out).  Your physician has requested that you have en exercise stress myoview. For further information please visit HugeFiesta[employer][email or web address]. Please follow instruction sheet, as given.    Follow-Up: Your physician recommends that you schedule a follow-up appointment in: 4-6 weeks with an APP a day that Dr.Turner is in the office   Any Other Special Instructions Will Be Listed Below (If Applicable).     If you need a refill on your cardiac medications before your next appointment, please call your pharmacy.

## 2016-11-19 NOTE — Addendum Note (Signed)
Addended by: Lamar Laundry on: 11/19/2016 12:09 PM   Modules accepted: Orders

## 2016-11-27 ENCOUNTER — Ambulatory Visit (INDEPENDENT_AMBULATORY_CARE_PROVIDER_SITE_OTHER): Payer: Medicare HMO

## 2016-11-27 DIAGNOSIS — R5383 Other fatigue: Secondary | ICD-10-CM | POA: Diagnosis not present

## 2016-11-27 DIAGNOSIS — R002 Palpitations: Secondary | ICD-10-CM | POA: Diagnosis not present

## 2016-12-01 ENCOUNTER — Telehealth (HOSPITAL_COMMUNITY): Payer: Self-pay | Admitting: *Deleted

## 2016-12-01 NOTE — Telephone Encounter (Signed)
Left message on voicemail per DPR in reference to upcoming appointment scheduled on 12/04/16 at 0930 with detailed instructions given per Myocardial Perfusion Study Information Sheet for the test. LM to arrive 15 minutes early, and that it is imperative to arrive on time for appointment to keep from having the test rescheduled. If you need to cancel or reschedule your appointment, please call the office within 24 hours of your appointment. Failure to do so may result in a cancellation of your appointment, and a $50 no show fee. Phone number given for call back for any questions.

## 2016-12-02 ENCOUNTER — Telehealth (HOSPITAL_COMMUNITY): Payer: Self-pay | Admitting: Radiology

## 2016-12-02 NOTE — Telephone Encounter (Signed)
Patient given detailed instructions per Myocardial Perfusion Study Information Sheet for the test on 12/04/2016 at 9:30. Patient notified to arrive 15 minutes early and that it is imperative to arrive on time for appointment to keep from having the test rescheduled.  If you need to cancel or reschedule your appointment, please call the office within 24 hours of your appointment. Failure to do so may result in a cancellation of your appointment, and a $50 no show fee. Patient verbalized understanding.EHK

## 2016-12-04 ENCOUNTER — Ambulatory Visit (HOSPITAL_COMMUNITY): Payer: Medicare HMO | Attending: Cardiovascular Disease

## 2016-12-04 DIAGNOSIS — R5383 Other fatigue: Secondary | ICD-10-CM | POA: Diagnosis not present

## 2016-12-04 LAB — MYOCARDIAL PERFUSION IMAGING
CHL CUP NUCLEAR SDS: 1
CHL CUP RESTING HR STRESS: 75 {beats}/min
CSEPED: 6 min
Estimated workload: 7 METS
Exercise duration (sec): 0 s
LV sys vol: 21 mL
LVDIAVOL: 70 mL (ref 46–106)
MPHR: 153 {beats}/min
Peak HR: 137 {beats}/min
Percent HR: 89 %
RATE: 0.2
SRS: 0
SSS: 1
TID: 0.87

## 2016-12-04 MED ORDER — TECHNETIUM TC 99M TETROFOSMIN IV KIT
33.0000 | PACK | Freq: Once | INTRAVENOUS | Status: AC | PRN
  Administered 2016-12-04: 33 via INTRAVENOUS
  Filled 2016-12-04: qty 33

## 2016-12-04 MED ORDER — TECHNETIUM TC 99M TETROFOSMIN IV KIT
10.8000 | PACK | Freq: Once | INTRAVENOUS | Status: AC | PRN
  Administered 2016-12-04: 10.8 via INTRAVENOUS
  Filled 2016-12-04: qty 11

## 2016-12-10 ENCOUNTER — Telehealth: Payer: Self-pay | Admitting: Cardiology

## 2016-12-10 NOTE — Telephone Encounter (Signed)
Patient was sent sensitive skin electrodes when [employer] mailed her cardiac event monitor.  Patient states, she has blisters approximately a half an inch from the electrodes.  She also states, she had less of a reaction from the standard electrodes.   I spoke with [employer] and informed them I would be instructing the patient to remove her monitor at least 3 days to give her skin a chance to heal.  I requested [employer] send the patient a different type of electrode, (Invisitrace), which, is Nickel free.  Patient will apply new electrodes to areas not affected.  If she reacts to the new electrodes, she is to contact me for one additional alternative.  If the alternative doesn't work, she can terminate service prior to 12/27/16 End of Service date.  Patient was instructed to contact [employer] to review allergic reaction per their protocol.  Patient was very appreciative for return call and instruction to remove monitor for at least 3 days.

## 2016-12-10 NOTE — Telephone Encounter (Signed)
New Message      Has a 30 day heart monitor on , having allergic reaction about a half inch away from where they touch skin, oozing blisters.

## 2016-12-29 ENCOUNTER — Encounter: Payer: Self-pay | Admitting: Cardiology

## 2016-12-31 ENCOUNTER — Ambulatory Visit (INDEPENDENT_AMBULATORY_CARE_PROVIDER_SITE_OTHER): Payer: Medicare HMO | Admitting: Cardiology

## 2016-12-31 ENCOUNTER — Encounter (INDEPENDENT_AMBULATORY_CARE_PROVIDER_SITE_OTHER): Payer: Self-pay

## 2016-12-31 ENCOUNTER — Encounter: Payer: Self-pay | Admitting: Cardiology

## 2016-12-31 VITALS — BP 148/74 | HR 81 | Ht 67.0 in | Wt 121.4 lb

## 2016-12-31 DIAGNOSIS — G4733 Obstructive sleep apnea (adult) (pediatric): Secondary | ICD-10-CM

## 2016-12-31 DIAGNOSIS — I493 Ventricular premature depolarization: Secondary | ICD-10-CM | POA: Diagnosis not present

## 2016-12-31 DIAGNOSIS — I471 Supraventricular tachycardia: Secondary | ICD-10-CM | POA: Diagnosis not present

## 2016-12-31 DIAGNOSIS — I4510 Unspecified right bundle-branch block: Secondary | ICD-10-CM | POA: Diagnosis not present

## 2016-12-31 MED ORDER — DILTIAZEM HCL ER COATED BEADS 120 MG PO CP24
120.0000 mg | ORAL_CAPSULE | Freq: Every day | ORAL | 3 refills | Status: DC
Start: 2016-12-31 — End: 2017-12-16

## 2016-12-31 NOTE — Progress Notes (Signed)
Cardiology Office Note    Date:  12/31/2016   ID:  Michele Jones, DOB 1951-02-08, MRN [id number]  PCP:  Gennette Pac, MD  Cardiologist:  Fransico Him, MD   Chief Complaint  Patient presents with  . Follow-up    SVT, RBBB, OSA    History of Present Illness:  Michele Jones is a 66 y.o. female with a history of symptomatic PVC's and PAC's, mild OSA on CPAP and diastolic dysfunction who presents today for followup. She is doing well with her CPAP. She tolerates her nasal pillow mask and feels the pressure is adequate. Some mornings she feels rested in the am but does not nap during the day. She does not snore. 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  She was noted to have RBBB and nuclear stress test was normal.  2D echo 6 2017 showed normal LVF.  She denies any chest pain or syncope. She says that when she goes up stairs she is getting more winded and has noticed more fatigue.  She has an appt with her Pulmonologist this month.  She occasionally has some mild LE edema.     Past Medical History:  Diagnosis Date  . Allergy   . Bronchiectasis ([place])   . COPD (chronic obstructive pulmonary disease) ([facility])    alpha 1 antitrypsin deficiency  . Fibromyalgia   . Malaria   . OSA (obstructive sleep apnea)    mild with AHI 5.8/hr now on CPAP at 10cm H2o  . Osteoporosis   . PAC (premature atrial contraction)   . Paroxysmal atrial tachycardia ([place])   . PVC's (premature ventricular contractions)   . RBBB    no ischemia on nuclear stree test    Past Surgical History:  Procedure Laterality Date  . APPENDECTOMY    . TEMPOROMANDIBULAR JOINT SURGERY      Current Medications: Outpatient Medications Prior to Visit  Medication Sig Dispense Refill  . ANORO ELLIPTA 62.5-25 MCG/INH AEPB Inhale 1 Dose into the lungs daily.  11  . azithromycin (ZITHROMAX) 500 MG tablet Take 1 tablet by mouth 3 (three)  times a week.  3  . Boron 3 MG CAPS Take by mouth daily.    . Calcium-Vitamin A-Vitamin D (LIQUID CALCIUM PO) Take 5 mLs by mouth daily.    [name] diltiazem (CARDIZEM) 30 MG tablet Take 1 tablet (30 mg total) by mouth daily as needed. For palpitations 30 tablet 5  . glucosamine-chondroitin 500-400 MG tablet Take 1 tablet by mouth 3 (three) times daily.    . Grape Seed 100 MG CAPS Take by mouth 2 (two) times daily.    . Multiple Vitamins-Minerals (MULTIVITAMIN WITH MINERALS) tablet Take 1 tablet by mouth daily.    [name] Leaf Extract 150 MG CAPS Take by mouth daily.    [name] PRASTERONE, DHEA, PO Take 0.5 mg by mouth. Takes one in the morning on mondays wednesdays and fridays    . Quercetin (QUERCITIN) POWD by Does not apply route daily.    . RESTASIS 0.05 % ophthalmic emulsion   0  . thiamine (VITAMIN B-1) 100 MG tablet Take 100 mg by mouth daily.    [name] zolpidem (AMBIEN) 10 MG tablet Take 10 mg by mouth at bedtime as needed for sleep.    . Albuterol Sulfate (PROAIR RESPICLICK) 123XX123 (90 Base) MCG/ACT AEPB Inhale 2 Doses into the lungs every 4 (four) hours as needed.  No facility-administered medications prior to visit.      Allergies:   Amoxicillin; Septra [sulfamethoxazole-trimethoprim]; Augmentin [amoxicillin-pot clavulanate]; Levaquin [levofloxacin in d5w]; Levofloxacin; and Sulfamethoxazole-trimethoprim   Social History   Social History  . Marital status: Married    Spouse name: N/A  . Number of children: N/A  . Years of education: N/A   Social History Main Topics  . Smoking status: Never Smoker  . Smokeless tobacco: Never Used  . Alcohol use No  . Drug use: No  . Sexual activity: Not Asked   Other Topics Concern  . None   Social History Narrative  . None     Family History:  The patient's family history includes Cancer in her father and maternal grandmother; Diabetes in her paternal grandmother; Hypertension in her brother; Stroke in her mother.   ROS:   Please see the  history of present illness.    ROS All other systems reviewed and are negative.  No flowsheet data found.     PHYSICAL EXAM:   VS:  BP (!) 148/74   Pulse 81   Ht 5\' 7"  (1.702 m)   Wt 121 lb 6.4 oz (55.1 kg)   SpO2 99%   BMI 19.01 kg/m    GEN: Well nourished, well developed, in no acute distress  HEENT: normal  Neck: no JVD, carotid bruits, or masses Cardiac: RRR; no murmurs, rubs, or gallops,no edema.  Intact distal pulses bilaterally.  Respiratory:  clear to auscultation bilaterally, normal work of breathing GI: soft, nontender, nondistended, + BS MS: no deformity or atrophy  Skin: warm and dry, no rash Neuro:  Alert and Oriented x 3, Strength and sensation are intact Psych: euthymic mood, full affect  Wt Readings from Last 3 Encounters:  12/31/16 121 lb 6.4 oz (55.1 kg)  11/19/16 119 lb (54 kg)  04/23/16 121 lb 6.4 oz (55.1 kg)      Studies/Labs Reviewed:   EKG:  EKG is not ordered today.    Recent Labs: No results found for requested labs within last 8760 hours.   Lipid Panel No results found for: CHOL, TRIG, HDL, CHOLHDL, VLDL, LDLCALC, LDLDIRECT  Additional studies/ records that were reviewed today include:  CPAP downlad    ASSESSMENT:    1. OSA (obstructive sleep apnea)   2. PVC's (premature ventricular contractions)   3. Paroxysmal atrial tachycardia ([place])   4. RBBB      PLAN:  In order of problems listed above:  OSA - the patient is tolerating PAP therapy well without any problems. The PAP download was reviewed today and showed an AHI of 0.3/hr on 10 cm H2O with 97% compliance in using more than 4 hours nightly.  The patient has been using and benefiting from CPAP use and will continue to benefit from therapy.  PVC's - controlled on CCB. Paroxysmal atrial tachycardia - she was placed on daily Cardizem which has helped with her PVCs and PACs but not with the atrial tachycardia.  I am going to change her to Cardizem CD to 120mg  daily and still use  the short acting cardizem for breakthrough.  RBBB - no ischemia on nuclear stress test and echo was fine     Medication Adjustments/Labs and Tests Ordered: Current medicines are reviewed at length with the patient today.  Concerns regarding medicines are outlined above.  Medication changes, Labs and Tests ordered today are listed in the Patient Instructions below.  There are no Patient Instructions on file for this visit.  Signed, Fransico Him, MD  12/31/2016 11:33 AM    [facility] [place], [place], [place]  [address] Phone: [phone]; Fax: [phone]

## 2016-12-31 NOTE — Patient Instructions (Signed)
Medication Instructions:  1) START CARDIZEM CD 120 mg daily  Labwork: None  Testing/Procedures: None  Follow-Up: Your physician recommends that you schedule a follow-up appointment in 2 WEEKS with Dr. Theodosia Blender assistant.  Your physician wants you to follow-up in: 1 year with Dr. Radford Pax. You will receive a reminder letter in the mail two months in advance. If you don't receive a letter, please call our office to schedule the follow-up appointment.   Any Other Special Instructions Will Be Listed Below (If Applicable).     If you need a refill on your cardiac medications before your next appointment, please call your pharmacy.

## 2017-01-01 DIAGNOSIS — E611 Iron deficiency: Secondary | ICD-10-CM | POA: Diagnosis not present

## 2017-01-08 ENCOUNTER — Encounter: Payer: Self-pay | Admitting: Physician Assistant

## 2017-01-11 DIAGNOSIS — E611 Iron deficiency: Secondary | ICD-10-CM | POA: Diagnosis not present

## 2017-01-13 DIAGNOSIS — G4733 Obstructive sleep apnea (adult) (pediatric): Secondary | ICD-10-CM | POA: Diagnosis not present

## 2017-01-13 DIAGNOSIS — Z681 Body mass index (BMI) 19 or less, adult: Secondary | ICD-10-CM | POA: Diagnosis not present

## 2017-01-13 DIAGNOSIS — E8801 Alpha-1-antitrypsin deficiency: Secondary | ICD-10-CM | POA: Diagnosis not present

## 2017-01-13 DIAGNOSIS — E048 Other specified nontoxic goiter: Secondary | ICD-10-CM | POA: Diagnosis not present

## 2017-01-13 DIAGNOSIS — Z1389 Encounter for screening for other disorder: Secondary | ICD-10-CM | POA: Diagnosis not present

## 2017-01-13 DIAGNOSIS — I471 Supraventricular tachycardia: Secondary | ICD-10-CM | POA: Diagnosis not present

## 2017-01-13 DIAGNOSIS — R42 Dizziness and giddiness: Secondary | ICD-10-CM | POA: Diagnosis not present

## 2017-01-13 DIAGNOSIS — M810 Age-related osteoporosis without current pathological fracture: Secondary | ICD-10-CM | POA: Diagnosis not present

## 2017-01-13 DIAGNOSIS — M797 Fibromyalgia: Secondary | ICD-10-CM | POA: Diagnosis not present

## 2017-01-14 ENCOUNTER — Ambulatory Visit (INDEPENDENT_AMBULATORY_CARE_PROVIDER_SITE_OTHER): Payer: Medicare HMO | Admitting: Physician Assistant

## 2017-01-14 ENCOUNTER — Encounter: Payer: Self-pay | Admitting: Physician Assistant

## 2017-01-14 VITALS — BP 152/84 | HR 82 | Ht 67.0 in | Wt 123.0 lb

## 2017-01-14 DIAGNOSIS — I471 Supraventricular tachycardia: Secondary | ICD-10-CM

## 2017-01-14 DIAGNOSIS — G4733 Obstructive sleep apnea (adult) (pediatric): Secondary | ICD-10-CM

## 2017-01-14 DIAGNOSIS — I491 Atrial premature depolarization: Secondary | ICD-10-CM

## 2017-01-14 DIAGNOSIS — I493 Ventricular premature depolarization: Secondary | ICD-10-CM | POA: Diagnosis not present

## 2017-01-14 NOTE — Progress Notes (Signed)
Cardiology Office Note    Date:  01/14/2017   ID:  Michele Jones, DOB 10/26/1951, MRN [id number]  PCP:  Michele Pac, MD  Cardiologist: Michele Jones  No chief complaint on file.   History of Present Illness:  Michele Jones is a 66 y.o. female  with a history of symptomatic PVC's and Jones's, PAT, mild OSA on CPAP and diastolic dysfunction, COPD with alpha 1 antitrypsin deficiency. Patient saw Michele. Radford Jones 12/31/16 for follow-up. Cardizem CD 120 mg was ordered daily to help with her atrial tachycardia as well as short acting Cardizem for breakthrough. She has a history of right bundle branch block and had normal nuclear stress test 12/04/16 LVEF 71%.  Patient comes in today for follow up. Multiple questions and wants to clarify some things in her chart. She's never used Cardizem 30 mg prn. She has remote history of syncope once while in [place] at age 77 and in [place] age 18. Sounds like frank syncope but she can't remember if she was dehydrated or all events surrounding it. Having some consitpation on cardizem but working through it with Michele. Earlean Jones. Has had increased fatigue on the current dose of Cardizem but hoping it improves. She has had no prolonged episodes of palpitations. She still has some but they're very short-lived lasting only seconds.   Past Medical History:  Diagnosis Date  . Allergy   . Bronchiectasis ([place])   . COPD (chronic obstructive pulmonary disease) (HCC)    alpha 1 antitrypsin deficiency  . Fibromyalgia   . Malaria   . OSA (obstructive sleep apnea)    mild with AHI 5.8/hr now on CPAP at 10cm H2o  . Osteoporosis   . Jones (premature atrial contraction)   . Paroxysmal atrial tachycardia ([place])   . PVC's (premature ventricular contractions)   . RBBB    no ischemia on nuclear stree test    Past Surgical History:  Procedure Laterality Date  . APPENDECTOMY    . TEMPOROMANDIBULAR JOINT SURGERY      Current Medications: Outpatient Medications Prior to Visit   Medication Sig Dispense Refill  . ANORO ELLIPTA 62.5-25 MCG/INH AEPB Inhale 1 Dose into the lungs daily.  11  . azithromycin (ZITHROMAX) 500 MG tablet Take 1 tablet by mouth 3 (three) times a week.  3  . Boron 3 MG CAPS Take by mouth daily.    . Calcium-Vitamin A-Vitamin D (LIQUID CALCIUM PO) Take 5 mLs by mouth daily.    [name] diltiazem (CARDIZEM CD) 120 MG 24 hr capsule Take 1 capsule (120 mg total) by mouth daily. 90 capsule 3  . diltiazem (CARDIZEM) 30 MG tablet Take 1 tablet (30 mg total) by mouth daily as needed. For palpitations 30 tablet 5  . glucosamine-chondroitin 500-400 MG tablet Take 1 tablet by mouth 3 (three) times daily.    . Grape Seed 100 MG CAPS Take by mouth 2 (two) times daily.    . Multiple Vitamins-Minerals (MULTIVITAMIN WITH MINERALS) tablet Take 1 tablet by mouth daily.    [name] Leaf Extract 150 MG CAPS Take by mouth daily.    [name] PRASTERONE, DHEA, PO Take 0.5 mg by mouth. Takes one in the morning on mondays wednesdays and fridays    . Quercetin (QUERCITIN) POWD by Does not apply route daily.    . RESTASIS 0.05 % ophthalmic emulsion   0  . thiamine (VITAMIN B-1) 100 MG tablet Take 100 mg by mouth daily.    [name] zolpidem (AMBIEN) 10 MG tablet Take 10  mg by mouth at bedtime as needed for sleep.     No facility-administered medications prior to visit.      Allergies:   Amoxicillin; Septra [sulfamethoxazole-trimethoprim]; Augmentin [amoxicillin-pot clavulanate]; Latex; Levaquin [levofloxacin in d5w]; Levofloxacin; Penicillins; Sulfamethoxazole-trimethoprim; and Tetracycline   Social History   Social History  . Marital status: Married    Spouse name: N/A  . Number of children: N/A  . Years of education: N/A   Social History Main Topics  . Smoking status: Never Smoker  . Smokeless tobacco: Never Used  . Alcohol use No  . Drug use: No  . Sexual activity: Not Asked   Other Topics Concern  . None   Social History Narrative  . None     Family History:  The  patient's   family history includes Cancer in her father and maternal grandmother; Diabetes in her paternal grandmother; Hypertension in her brother; Stroke in her mother.   ROS:   Please see the history of present illness.    Review of Systems  Constitution: Positive for malaise/fatigue.  Cardiovascular: Positive for dyspnea on exertion and irregular heartbeat.  Respiratory: Positive for sleep disturbances due to breathing.   Gastrointestinal: Positive for constipation.   All other systems reviewed and are negative.   PHYSICAL EXAM:   VS:  BP (!) 152/84   Pulse 82   Ht 5\' 7"  (1.702 m)   Wt 123 lb (55.8 kg)   BMI 19.26 kg/m   Physical Exam  GEN: Well nourished, well developed, in no acute distress  Neck: no JVD, carotid bruits, or masses Cardiac:RRR; no murmurs, rubs, or gallops  Respiratory:  clear to auscultation bilaterally, normal work of breathing GI: soft, nontender, nondistended, + BS Ext: without cyanosis, clubbing, or edema, Good distal pulses bilaterally Psych: euthymic mood, full affect  Wt Readings from Last 3 Encounters:  01/14/17 123 lb (55.8 kg)  12/31/16 121 lb 6.4 oz (55.1 kg)  11/19/16 119 lb (54 kg)      Studies/Labs Reviewed:   EKG:  EKG is not ordered today.   Recent Labs: No results found for requested labs within last 8760 hours.   Lipid Panel No results found for: CHOL, TRIG, HDL, CHOLHDL, VLDL, LDLCALC, LDLDIRECT  Additional studies/ records that were reviewed today include:   Nuclear stress test 12/04/16  Nuclear stress EF: 71%.  The study is normal.  This is a low risk study.  The left ventricular ejection fraction is hyperdynamic (>65%).  There was no ST segment deviation noted during stress.   Normal resting and stress perfusion. No ischemia or infarction EF 71%    2-D echo 05/11/16 Study Conclusions   - Left ventricle: The cavity size was normal. Systolic function was   normal. The estimated ejection fraction was in the range  of 55%   to 60%. Wall motion was normal; there were no regional wall   motion abnormalities. Left ventricular diastolic function   parameters were normal.       ASSESSMENT:    1. Paroxysmal atrial tachycardia ([place])   2. Jones (premature atrial contraction)   3. PVC's (premature ventricular contractions)   4. OSA (obstructive sleep apnea)      PLAN:  In order of problems listed above:  PAT with PACs and PVCs better controlled on diltiazem 120 mg daily. She is having increased fatigue and constipation but is trying to work through this. She has not used short acting Cardizem for breakthrough. Follow-up with Michele. Radford Jones in 2-3 months.  Obstructive sleep apnea on C Pap  Right bundle branch block with normal nuclear stress test    Medication Adjustments/Labs and Tests Ordered: Current medicines are reviewed at length with the patient today.  Concerns regarding medicines are outlined above.  Medication changes, Labs and Tests ordered today are listed in the Patient Instructions below. Patient Instructions  Your physician recommends that you continue on your current medications as directed. Please refer to the Current Medication list given to you today.   Your physician recommends that you schedule a follow-up appointment in:  [place], Ermalinda Barrios, PA-C  01/14/2017 11:14 AM    [facility] [place], [place], [place]  [address] Phone: [phone]; Fax: [phone]

## 2017-01-14 NOTE — Patient Instructions (Addendum)
Your physician recommends that you continue on your current medications as directed. Please refer to the Current Medication list given to you today. Your physician recommends that you schedule a follow-up appointment in: 3 MONTHS WITH DR TURNER  

## 2017-01-18 DIAGNOSIS — K5900 Constipation, unspecified: Secondary | ICD-10-CM | POA: Diagnosis not present

## 2017-01-18 DIAGNOSIS — R932 Abnormal findings on diagnostic imaging of liver and biliary tract: Secondary | ICD-10-CM | POA: Diagnosis not present

## 2017-01-18 DIAGNOSIS — E8801 Alpha-1-antitrypsin deficiency: Secondary | ICD-10-CM | POA: Diagnosis not present

## 2017-01-18 DIAGNOSIS — I471 Supraventricular tachycardia: Secondary | ICD-10-CM | POA: Diagnosis not present

## 2017-01-18 DIAGNOSIS — J439 Emphysema, unspecified: Secondary | ICD-10-CM | POA: Diagnosis not present

## 2017-01-18 DIAGNOSIS — J441 Chronic obstructive pulmonary disease with (acute) exacerbation: Secondary | ICD-10-CM | POA: Diagnosis not present

## 2017-01-18 DIAGNOSIS — J301 Allergic rhinitis due to pollen: Secondary | ICD-10-CM | POA: Diagnosis not present

## 2017-01-18 DIAGNOSIS — G4733 Obstructive sleep apnea (adult) (pediatric): Secondary | ICD-10-CM | POA: Diagnosis not present

## 2017-01-19 ENCOUNTER — Telehealth: Payer: Self-pay | Admitting: Cardiology

## 2017-01-19 NOTE — Telephone Encounter (Signed)
New Message    Pt has questions on DHEA .7mg  and pregneonlone .2 mg diltiazem (CARDIZEM CD) 120 MG 24 hr capsule Take 1 capsule (120 mg total) by mouth daily.   Is there interaction with these medications? She wants to know before she starts taking these medications together

## 2017-01-19 NOTE — Telephone Encounter (Signed)
Returned call to patient. She is starting 2 new compounded capsules and is wondering if they interact with her diltiazem. Pregnenolone - endogenous steroid, and DHEA  (dehydroepiandrosterone) - precursor to sex hormones. Both are ok to take with her diltiazem. No other questions.

## 2017-01-21 DIAGNOSIS — H43393 Other vitreous opacities, bilateral: Secondary | ICD-10-CM | POA: Diagnosis not present

## 2017-01-21 DIAGNOSIS — H5310 Unspecified subjective visual disturbances: Secondary | ICD-10-CM | POA: Diagnosis not present

## 2017-01-26 DIAGNOSIS — G4733 Obstructive sleep apnea (adult) (pediatric): Secondary | ICD-10-CM | POA: Diagnosis not present

## 2017-01-27 DIAGNOSIS — D509 Iron deficiency anemia, unspecified: Secondary | ICD-10-CM | POA: Diagnosis not present

## 2017-02-04 DIAGNOSIS — D509 Iron deficiency anemia, unspecified: Secondary | ICD-10-CM | POA: Diagnosis not present

## 2017-02-04 DIAGNOSIS — R5381 Other malaise: Secondary | ICD-10-CM | POA: Diagnosis not present

## 2017-02-04 DIAGNOSIS — N951 Menopausal and female climacteric states: Secondary | ICD-10-CM | POA: Diagnosis not present

## 2017-02-10 DIAGNOSIS — J309 Allergic rhinitis, unspecified: Secondary | ICD-10-CM | POA: Diagnosis not present

## 2017-02-10 DIAGNOSIS — E8801 Alpha-1-antitrypsin deficiency: Secondary | ICD-10-CM | POA: Diagnosis not present

## 2017-02-10 DIAGNOSIS — Z0000 Encounter for general adult medical examination without abnormal findings: Secondary | ICD-10-CM | POA: Diagnosis not present

## 2017-02-10 DIAGNOSIS — G4700 Insomnia, unspecified: Secondary | ICD-10-CM | POA: Diagnosis not present

## 2017-02-10 DIAGNOSIS — G4730 Sleep apnea, unspecified: Secondary | ICD-10-CM | POA: Diagnosis not present

## 2017-02-10 DIAGNOSIS — I479 Paroxysmal tachycardia, unspecified: Secondary | ICD-10-CM | POA: Diagnosis not present

## 2017-02-10 DIAGNOSIS — D509 Iron deficiency anemia, unspecified: Secondary | ICD-10-CM | POA: Diagnosis not present

## 2017-02-10 DIAGNOSIS — J439 Emphysema, unspecified: Secondary | ICD-10-CM | POA: Diagnosis not present

## 2017-02-10 DIAGNOSIS — Z681 Body mass index (BMI) 19 or less, adult: Secondary | ICD-10-CM | POA: Diagnosis not present

## 2017-02-10 DIAGNOSIS — M3500 Sicca syndrome, unspecified: Secondary | ICD-10-CM | POA: Diagnosis not present

## 2017-02-15 DIAGNOSIS — H903 Sensorineural hearing loss, bilateral: Secondary | ICD-10-CM | POA: Diagnosis not present

## 2017-04-01 DIAGNOSIS — Z136 Encounter for screening for cardiovascular disorders: Secondary | ICD-10-CM | POA: Diagnosis not present

## 2017-04-01 DIAGNOSIS — Z13220 Encounter for screening for lipoid disorders: Secondary | ICD-10-CM | POA: Diagnosis not present

## 2017-04-01 DIAGNOSIS — J0100 Acute maxillary sinusitis, unspecified: Secondary | ICD-10-CM | POA: Diagnosis not present

## 2017-04-01 DIAGNOSIS — E8801 Alpha-1-antitrypsin deficiency: Secondary | ICD-10-CM | POA: Diagnosis not present

## 2017-04-12 DIAGNOSIS — J028 Acute pharyngitis due to other specified organisms: Secondary | ICD-10-CM | POA: Diagnosis not present

## 2017-04-12 DIAGNOSIS — R5383 Other fatigue: Secondary | ICD-10-CM | POA: Diagnosis not present

## 2017-04-12 DIAGNOSIS — J069 Acute upper respiratory infection, unspecified: Secondary | ICD-10-CM | POA: Diagnosis not present

## 2017-04-12 DIAGNOSIS — J310 Chronic rhinitis: Secondary | ICD-10-CM | POA: Diagnosis not present

## 2017-04-13 NOTE — Progress Notes (Signed)
Cardiology Office Note    Date:  04/14/2017   ID:  Michele Jones, DOB 05/02/1951, MRN [id number]  PCP:  Dineen Kid, MD  Cardiologist:  Fransico Him, MD   Chief Complaint  Patient presents with  . Sleep Apnea    History of Present Illness:  Curlie Sittner is a 66 y.o. female with a history of symptomatic PVC's and PAC's, nonsustained atrial tachycardia, mild OSA on CPAP and diastolic dysfunction.   She presents today for followup and is doing well.  She is using her CPAP and doing well with it. She tolerates her nasal pillow mask and feels the pressure is adequate. Some mornings she feels rested but other days she isn't.  She does not nap during the day. She does not snore. She has no significant mouth dryness.  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 by Pulmonology. She has a history of RBBB and nuclear stress test 11/2016 was normal.  2D echo 2017 showed normal LVF .  She denies any chest pain or pressure, PND or orthopnea,  No dizziness or syncope. She has been having problems with a sinus infection and allergies recently and has noticed more palpitations recently.  She took doxycycline but that did not help and then took a Zpac.  She is no referred to ENT for further evaluation.  She brings in her BP readings today and are normal ranging from 107-140/60-70's.  She has had some problems with fatigue while being treated for her infection.     Past Medical History:  Diagnosis Date  . Allergy   . Bronchiectasis ([place])   . COPD (chronic obstructive pulmonary disease) (HCC)    alpha 1 antitrypsin deficiency  . Fibromyalgia   . Malaria   . OSA (obstructive sleep apnea)    mild with AHI 5.8/hr now on CPAP at 10cm H2o  . Osteoporosis   . PAC (premature atrial contraction)   . Paroxysmal atrial tachycardia ([place])   . PVC's (premature ventricular contractions)   . RBBB    no ischemia on nuclear stree test     Past Surgical History:  Procedure Laterality Date  . APPENDECTOMY    . TEMPOROMANDIBULAR JOINT SURGERY      Current Medications: Current Meds  Medication Sig  . ANORO ELLIPTA 62.5-25 MCG/INH AEPB Inhale 1 Dose into the lungs daily.  [name] azithromycin (ZITHROMAX) 500 MG tablet Take 1 tablet by mouth 3 (three) times a week.  . Boron 3 MG CAPS Take by mouth daily.  . Calcium-Vitamin A-Vitamin D (LIQUID CALCIUM PO) Take 5 mLs by mouth daily.  [name] diltiazem (CARDIZEM CD) 120 MG 24 hr capsule Take 1 capsule (120 mg total) by mouth daily.  [name] diltiazem (CARDIZEM) 30 MG tablet Take 1 tablet (30 mg total) by mouth daily as needed. For palpitations  . glucosamine-chondroitin 500-400 MG tablet Take 1 tablet by mouth 3 (three) times daily.  . Grape Seed 100 MG CAPS Take by mouth 2 (two) times daily.  . Multiple Vitamins-Minerals (MULTIVITAMIN WITH MINERALS) tablet Take 1 tablet by mouth daily.  [name] Leaf Extract 150 MG CAPS Take by mouth daily.  [name] PRASTERONE, DHEA, PO Take 0.5 mg by mouth. Takes one in the morning on mondays wednesdays and fridays  . Quercetin (QUERCITIN) POWD by Does not apply route daily.  . RESTASIS 0.05 % ophthalmic emulsion   . thiamine (VITAMIN B-1) 100 MG tablet Take 100  mg by mouth daily.  [name] zolpidem (AMBIEN) 10 MG tablet Take 10 mg by mouth at bedtime as needed for sleep.    Allergies:   Amoxicillin; Septra [sulfamethoxazole-trimethoprim]; Augmentin [amoxicillin-pot clavulanate]; Latex; Levaquin [levofloxacin in d5w]; Levofloxacin; Penicillins; Sulfamethoxazole-trimethoprim; and Tetracycline   Social History   Social History  . Marital status: Married    Spouse name: N/A  . Number of children: N/A  . Years of education: N/A   Social History Main Topics  . Smoking status: Never Smoker  . Smokeless tobacco: Never Used  . Alcohol use No  . Drug use: No  . Sexual activity: Not on file   Other Topics Concern  . Not on file   Social History Narrative  . No  narrative on file     Family History:  The patient's family history includes Cancer in her father and maternal grandmother; Diabetes in her paternal grandmother; Hypertension in her brother; Stroke in her mother.   ROS:   Please see the history of present illness.    ROS All other systems reviewed and are negative.  No flowsheet data found.     PHYSICAL EXAM:   VS:  BP 136/80 (BP Location: Left Arm, Patient Position: Sitting, Cuff Size: Normal)   Pulse 78   Ht 5\' 7"  (1.702 m)   Wt 119 lb 6.4 oz (54.2 kg)   SpO2 100% Comment: at rest  BMI 18.70 kg/m    GEN: Well nourished, well developed, in no acute distress  HEENT: normal  Neck: no JVD, carotid bruits, or masses Cardiac: RRR; no murmurs, rubs, or gallops,no edema.  Intact distal pulses bilaterally.  Respiratory:  clear to auscultation bilaterally, normal work of breathing GI: soft, nontender, nondistended, + BS MS: no deformity or atrophy  Skin: warm and dry, no rash Neuro:  Alert and Oriented x 3, Strength and sensation are intact Psych: euthymic mood, full affect  Wt Readings from Last 3 Encounters:  04/14/17 119 lb 6.4 oz (54.2 kg)  01/14/17 123 lb (55.8 kg)  12/31/16 121 lb 6.4 oz (55.1 kg)      Studies/Labs Reviewed:   EKG:  EKG is not ordered today.    Recent Labs: No results found for requested labs within last 8760 hours.   Lipid Panel No results found for: CHOL, TRIG, HDL, CHOLHDL, VLDL, LDLCALC, LDLDIRECT  Additional studies/ records that were reviewed today include:  CPAP download    ASSESSMENT:    1. OSA (obstructive sleep apnea)   2. Paroxysmal atrial tachycardia ([place])   3. PVC's (premature ventricular contractions)      PLAN:  In order of problems listed above:  OSA - the patient is tolerating PAP therapy well without any problems. The PAP download was reviewed today and showed an AHI of 0.5/hr on 10 cm H2O with 100% compliance in using more than 4 hours nightly.  The patient has been  using and benefiting from CPAP use and will continue to benefit from therapy.  Paroxysmal atrial tachycardia - she is now having more palpitations despite the CCB.  I will get an event monitor to assess further and make sure that she is not having PAF.  She will continue on Cardizem 120mg  daily for suppression with short acting Cardizem for breakthrough palpitations.  I will check a BMET, Mg and TSH.  PVC's and palpitations- these have increased in frequency recently most likely due to stress of sinus infection. Continue CCB.  I cautioned her not to use a decongestant as  it could increase her ectopy.      Medication Adjustments/Labs and Tests Ordered: Current medicines are reviewed at length with the patient today.  Concerns regarding medicines are outlined above.  Medication changes, Labs and Tests ordered today are listed in the Patient Instructions below.  There are no Patient Instructions on file for this visit.   Signed, Fransico Him, MD  04/14/2017 9:39 AM    [facility] [address], [place], [place]  [address] Phone: [phone]; Fax: [phone]

## 2017-04-14 ENCOUNTER — Ambulatory Visit (INDEPENDENT_AMBULATORY_CARE_PROVIDER_SITE_OTHER): Payer: Medicare HMO | Admitting: Cardiology

## 2017-04-14 ENCOUNTER — Telehealth: Payer: Self-pay | Admitting: *Deleted

## 2017-04-14 VITALS — BP 136/80 | HR 78 | Ht 67.0 in | Wt 119.4 lb

## 2017-04-14 DIAGNOSIS — G4733 Obstructive sleep apnea (adult) (pediatric): Secondary | ICD-10-CM | POA: Diagnosis not present

## 2017-04-14 DIAGNOSIS — I471 Supraventricular tachycardia: Secondary | ICD-10-CM

## 2017-04-14 DIAGNOSIS — I493 Ventricular premature depolarization: Secondary | ICD-10-CM | POA: Diagnosis not present

## 2017-04-14 LAB — BASIC METABOLIC PANEL
BUN/Creatinine Ratio: 17 (ref 12–28)
BUN: 13 mg/dL (ref 8–27)
CALCIUM: 9.8 mg/dL (ref 8.7–10.3)
CHLORIDE: 102 mmol/L (ref 96–106)
CO2: 25 mmol/L (ref 18–29)
Creatinine, Ser: 0.77 mg/dL (ref 0.57–1.00)
GFR calc Af Amer: 92 mL/min/{1.73_m2} (ref >59)
GFR, EST NON AFRICAN AMERICAN: 80 mL/min/{1.73_m2} (ref >59)
GLUCOSE: 48 mg/dL — AB (ref 65–99)
Potassium: 4.2 mmol/L (ref 3.5–5.2)
Sodium: 144 mmol/L (ref 134–144)

## 2017-04-14 LAB — TSH: TSH: 2.46 u[IU]/mL (ref 0.450–4.500)

## 2017-04-14 LAB — MAGNESIUM: MAGNESIUM: 2.1 mg/dL (ref 1.6–2.3)

## 2017-04-14 NOTE — Telephone Encounter (Signed)
S/w christy @ [facility] with Dr. Lynelle Doctor will send pt's recent lipids to office. Lenice Llamas, RN, is aware.

## 2017-04-14 NOTE — Patient Instructions (Addendum)
Medication Instructions:  Your physician recommends that you continue on your current medications as directed. Please refer to the Current Medication list given to you today.   Labwork: TODAY: BMET, Mag, TSH  Testing/Procedures: Your physician has recommended that you wear an event monitor. Event monitors are medical devices that record the heart's electrical activity. Doctors most often [place] these monitors to diagnose arrhythmias. Arrhythmias are problems with the speed or rhythm of the heartbeat. The monitor is a small, portable device. You can wear one while you do your normal daily activities. This is usually used to diagnose what is causing palpitations/syncope (passing out).  Follow-Up: Your physician wants you to follow-up in: 1 year with Dr. Radford Pax. You will receive a reminder letter in the mail two months in advance. If you don't receive a letter, please call our office to schedule the follow-up appointment.   Any Other Special Instructions Will Be Listed Below (If Applicable).     If you need a refill on your cardiac medications before your next appointment, please call your pharmacy.

## 2017-04-19 ENCOUNTER — Telehealth: Payer: Self-pay | Admitting: Cardiology

## 2017-04-19 DIAGNOSIS — E782 Mixed hyperlipidemia: Secondary | ICD-10-CM | POA: Diagnosis not present

## 2017-04-19 DIAGNOSIS — J324 Chronic pansinusitis: Secondary | ICD-10-CM | POA: Diagnosis not present

## 2017-04-19 DIAGNOSIS — E611 Iron deficiency: Secondary | ICD-10-CM | POA: Diagnosis not present

## 2017-04-19 DIAGNOSIS — R5383 Other fatigue: Secondary | ICD-10-CM | POA: Diagnosis not present

## 2017-04-19 NOTE — Telephone Encounter (Signed)
New Message     Patient needs to know the blood sugar number that Dr Radford Pax told her , she is going to her PCP today

## 2017-04-19 NOTE — Telephone Encounter (Signed)
Reviewed patient's lab work again. Informed her all her labs are forwarded to PCP. She was grateful for assistance.

## 2017-04-20 ENCOUNTER — Ambulatory Visit (INDEPENDENT_AMBULATORY_CARE_PROVIDER_SITE_OTHER): Payer: Medicare HMO

## 2017-04-20 DIAGNOSIS — I493 Ventricular premature depolarization: Secondary | ICD-10-CM | POA: Diagnosis not present

## 2017-04-20 DIAGNOSIS — I471 Supraventricular tachycardia: Secondary | ICD-10-CM

## 2017-04-28 ENCOUNTER — Telehealth: Payer: Self-pay | Admitting: Cardiology

## 2017-04-28 NOTE — Telephone Encounter (Signed)
Informed patient that follow-up is expected in 1 year, but will be rescheduled to earlier if monitor results warrant. She was grateful for call and agrees with treatment plan.

## 2017-04-28 NOTE — Telephone Encounter (Signed)
New message    Pt is calling asking if she needs to schedule an appt with Dr. Radford Pax for sometime after she completes wearing the heart monitor for 30 days.

## 2017-04-30 DIAGNOSIS — G4733 Obstructive sleep apnea (adult) (pediatric): Secondary | ICD-10-CM | POA: Diagnosis not present

## 2017-04-30 DIAGNOSIS — K5904 Chronic idiopathic constipation: Secondary | ICD-10-CM | POA: Diagnosis not present

## 2017-05-03 ENCOUNTER — Telehealth: Payer: Self-pay | Admitting: Cardiology

## 2017-05-03 NOTE — Telephone Encounter (Signed)
New message      Pt is wearing a monitor.  She is having a rash from the electrodes. Pt is wearing the sensitive skin electrodes.  Pt is calling to see if Dr Radford Pax has enough info and pt can stop wearing the monitor. Please call

## 2017-05-03 NOTE — Telephone Encounter (Signed)
Patient states she has skin breakage from her monitor. Reiterated to the patient that the longer she wears the monitor, the more information Dr. Radford Pax has to treat her appropriately. She understands she has a couple options: she can give her skin a few days' break and wear the monitor for the last week or turn in early against MD recommendations. She understands to call the monitor company to inform them she is sending it in early if she chooses that route. She was grateful for call and will attempt to wear monitor more.

## 2017-05-05 ENCOUNTER — Other Ambulatory Visit: Payer: Self-pay | Admitting: Family Medicine

## 2017-05-05 DIAGNOSIS — E049 Nontoxic goiter, unspecified: Secondary | ICD-10-CM

## 2017-05-12 DIAGNOSIS — J309 Allergic rhinitis, unspecified: Secondary | ICD-10-CM | POA: Diagnosis not present

## 2017-05-12 DIAGNOSIS — J329 Chronic sinusitis, unspecified: Secondary | ICD-10-CM | POA: Diagnosis not present

## 2017-05-17 ENCOUNTER — Telehealth: Payer: Self-pay | Admitting: Cardiology

## 2017-05-17 NOTE — Telephone Encounter (Signed)
Informed patient it's ok to remove monitor and send back a couple of days early.  Let patient know if she feels her rash needs medical attention, to please contact Dr. Radford Pax.  Patient stated, she put benadryl cream on it, which, has made it subside significantly.

## 2017-05-17 NOTE — Telephone Encounter (Signed)
New message     Her phone was down last Friday, she took the monitor off last night because she got a severe rash from it, is it ok to send it back early

## 2017-07-21 DIAGNOSIS — E8801 Alpha-1-antitrypsin deficiency: Secondary | ICD-10-CM | POA: Diagnosis not present

## 2017-07-21 DIAGNOSIS — M810 Age-related osteoporosis without current pathological fracture: Secondary | ICD-10-CM | POA: Diagnosis not present

## 2017-07-21 DIAGNOSIS — E048 Other specified nontoxic goiter: Secondary | ICD-10-CM | POA: Diagnosis not present

## 2017-07-21 DIAGNOSIS — I471 Supraventricular tachycardia: Secondary | ICD-10-CM | POA: Diagnosis not present

## 2017-07-21 DIAGNOSIS — K5909 Other constipation: Secondary | ICD-10-CM | POA: Diagnosis not present

## 2017-07-21 DIAGNOSIS — J45909 Unspecified asthma, uncomplicated: Secondary | ICD-10-CM | POA: Diagnosis not present

## 2017-07-21 DIAGNOSIS — Z1389 Encounter for screening for other disorder: Secondary | ICD-10-CM | POA: Diagnosis not present

## 2017-07-21 DIAGNOSIS — M797 Fibromyalgia: Secondary | ICD-10-CM | POA: Diagnosis not present

## 2017-07-21 DIAGNOSIS — G4733 Obstructive sleep apnea (adult) (pediatric): Secondary | ICD-10-CM | POA: Diagnosis not present

## 2017-07-21 DIAGNOSIS — Z681 Body mass index (BMI) 19 or less, adult: Secondary | ICD-10-CM | POA: Diagnosis not present

## 2017-07-23 ENCOUNTER — Ambulatory Visit
Admission: RE | Admit: 2017-07-23 | Discharge: 2017-07-23 | Disposition: A | Discharge status: Home / Self Care | Payer: Medicare HMO | Source: Ambulatory Visit | Attending: Family Medicine | Admitting: Family Medicine

## 2017-07-23 DIAGNOSIS — E049 Nontoxic goiter, unspecified: Secondary | ICD-10-CM

## 2017-07-23 DIAGNOSIS — E041 Nontoxic single thyroid nodule: Secondary | ICD-10-CM | POA: Diagnosis not present

## 2017-07-28 DIAGNOSIS — E049 Nontoxic goiter, unspecified: Secondary | ICD-10-CM | POA: Diagnosis not present

## 2017-07-28 DIAGNOSIS — R5381 Other malaise: Secondary | ICD-10-CM | POA: Diagnosis not present

## 2017-07-28 DIAGNOSIS — D509 Iron deficiency anemia, unspecified: Secondary | ICD-10-CM | POA: Diagnosis not present

## 2017-08-04 DIAGNOSIS — G4733 Obstructive sleep apnea (adult) (pediatric): Secondary | ICD-10-CM | POA: Diagnosis not present

## 2017-08-18 DIAGNOSIS — Z1231 Encounter for screening mammogram for malignant neoplasm of breast: Secondary | ICD-10-CM | POA: Diagnosis not present

## 2017-09-06 DIAGNOSIS — I471 Supraventricular tachycardia: Secondary | ICD-10-CM | POA: Diagnosis not present

## 2017-09-06 DIAGNOSIS — J301 Allergic rhinitis due to pollen: Secondary | ICD-10-CM | POA: Diagnosis not present

## 2017-09-06 DIAGNOSIS — J449 Chronic obstructive pulmonary disease, unspecified: Secondary | ICD-10-CM | POA: Diagnosis not present

## 2017-09-06 DIAGNOSIS — J441 Chronic obstructive pulmonary disease with (acute) exacerbation: Secondary | ICD-10-CM | POA: Diagnosis not present

## 2017-09-22 DIAGNOSIS — R69 Illness, unspecified: Secondary | ICD-10-CM | POA: Diagnosis not present

## 2017-10-08 DIAGNOSIS — R5383 Other fatigue: Secondary | ICD-10-CM | POA: Diagnosis not present

## 2017-10-08 DIAGNOSIS — H6980 Other specified disorders of Eustachian tube, unspecified ear: Secondary | ICD-10-CM | POA: Diagnosis not present

## 2017-10-08 DIAGNOSIS — G4733 Obstructive sleep apnea (adult) (pediatric): Secondary | ICD-10-CM | POA: Diagnosis not present

## 2017-10-08 DIAGNOSIS — J310 Chronic rhinitis: Secondary | ICD-10-CM | POA: Diagnosis not present

## 2017-10-08 DIAGNOSIS — J329 Chronic sinusitis, unspecified: Secondary | ICD-10-CM | POA: Diagnosis not present

## 2017-11-05 DIAGNOSIS — G4733 Obstructive sleep apnea (adult) (pediatric): Secondary | ICD-10-CM | POA: Diagnosis not present

## 2017-11-17 ENCOUNTER — Other Ambulatory Visit: Payer: Self-pay | Admitting: Cardiology

## 2017-12-16 ENCOUNTER — Telehealth (HOSPITAL_COMMUNITY): Payer: Self-pay

## 2017-12-16 ENCOUNTER — Other Ambulatory Visit: Payer: Self-pay | Admitting: Cardiology

## 2017-12-29 DIAGNOSIS — R69 Illness, unspecified: Secondary | ICD-10-CM | POA: Diagnosis not present

## 2018-01-06 ENCOUNTER — Telehealth: Payer: Self-pay | Admitting: Cardiology

## 2018-01-06 DIAGNOSIS — R002 Palpitations: Secondary | ICD-10-CM

## 2018-01-06 NOTE — Telephone Encounter (Signed)
Spoke with patient, she c/o palpitations for the past 2 weeks and for the past 2 days she has been having intermittent chest pain, mild sob, and dizziness. She states the chest pain comes off and on with just sitting. Patient's BP this morning was 133/69 HR 71. Patient states she took prn cardizem 30 mg, and symptoms have improved a little. Patient is scheduled with Melina Copa, PA on 01/20/18 (first available). Informed patient I would forward to Dr. Radford Pax for additional recommendations. Patient verbalized understanding and thanked me for the call.

## 2018-01-06 NOTE — Telephone Encounter (Signed)
Please set up for event monitor

## 2018-01-06 NOTE — Telephone Encounter (Signed)
Patient made aware of Dr. Theodosia Blender recommendation for event monitor. Patient in agreement with plan and scheduled to come in on 01/13/18. Patient verbalized understanding and thanked me for the call.

## 2018-01-06 NOTE — Telephone Encounter (Signed)
New message  Patient calling with concerns about palpitations. Please call  Patient c/o Palpitations:  High priority if patient c/o lightheadedness, shortness of breath, or chest pain  1) How long have you had palpitations/irregular HR/ Afib? Are you having the symptoms now? 3 weeks of palpitations  2) Are you currently experiencing lightheadedness, SOB or CP? A "littlte" bit of Chest pain, SOB  3) Do you have a history of afib (atrial fibrillation) or irregular heart rhythm? YES  4) Have you checked your BP or HR? (document readings if available): 133/69 hr 71 on Feb 4  5) Are you experiencing any other symptoms? fatigue

## 2018-01-13 ENCOUNTER — Ambulatory Visit (INDEPENDENT_AMBULATORY_CARE_PROVIDER_SITE_OTHER): Payer: Medicare HMO

## 2018-01-13 ENCOUNTER — Encounter (INDEPENDENT_AMBULATORY_CARE_PROVIDER_SITE_OTHER): Payer: Self-pay

## 2018-01-13 DIAGNOSIS — R002 Palpitations: Secondary | ICD-10-CM | POA: Diagnosis not present

## 2018-01-13 DIAGNOSIS — R69 Illness, unspecified: Secondary | ICD-10-CM | POA: Diagnosis not present

## 2018-01-18 NOTE — Progress Notes (Signed)
Cardiology Office Note    Date:  01/20/2018  ID:  Michele Jones, DOB Feb 20, 1951, MRN [id number] PCP:  Dineen Kid, MD  Cardiologist:  Dr. Radford Pax   Chief Complaint: follow-up palpitations  History of Present Illness:  Michele Jones is a 67 y.o. female with history of symptomatic PVC's and PAC's, nonsustained atrial tachycardia, mild OSA on CPAP, diastolic dysfunction (not seen on 2017 echo), RBBB, alpha trypsin 1 def and COPD with bronchiectasis, chronic shortness of breath, fibromyalgia who presents for evaluation of palpitations  Most recent stress test 11/2016 was normal, EF 71%. Event monitor in 10/2016 showed NSR, nonsustained atrial tach up to 13 beats, occasional PACs and atrial couplets/triplets. Event monitor 04/2017 showed NSR with frequent PACs, nonsustained atrial tach up to 8 beats and rare PACs. Last labs 03/2017 showed normal TSH, Mg, and BMET except glucose 48 - K 4.2, Mg 2.1. Last Hgb at PCP 12.4 and LDL 138by labs 03/2017 (followed by primary care). She called in to report palpitations and event monitor was placed. This is still in process but so far preliminary report given to be looks to show PACs and nonsustained atrial tach. She reports traditionally her palpitations flare up around the time of frequent colds which she attributes to her underlying lung disease. She is on prophylactic azithromycin. The palpitations flared up around a cold/sinusitis in January and seem to have settled down somewhat but are still occurring occasionally. She also has history of chronic fatigue even back as a teenager but feels this is somewhat worse lately. She has also had atypical sharp pains in her left chest as well as a pushing/poking sensation under her left arm. This has occurred both with exertion and at rest but not reliably reproduced with either of these. It lasts several seconds then resolves. She noticed her DOE was worse last week for a day but seems to have returned to baseline. She  feels well today. Last labs 03/2017 showed normal TSH, Mg, and BMET except glucose 48 - K 4.2, Mg 2.1. Last Hgb at PCP 12.4 and LDL 138by labs 03/2017 (followed by primary care). She reports compliance with OSA. She also endorses a hx of anemia. Denied unusual weight loss or night sweats but weight is down 9lb since last year per our scales.    Past Medical History:  Diagnosis Date  . Allergy   . Bronchiectasis ([place])   . COPD (chronic obstructive pulmonary disease) (HCC)    alpha 1 antitrypsin deficiency  . Fibromyalgia   . Malaria   . OSA (obstructive sleep apnea)    mild with AHI 5.8/hr now on CPAP at 10cm H2o  . Osteoporosis   . PAC (premature atrial contraction)   . Paroxysmal atrial tachycardia ([place])   . PVC's (premature ventricular contractions)   . RBBB    no ischemia on nuclear stree test    Past Surgical History:  Procedure Laterality Date  . APPENDECTOMY    . TEMPOROMANDIBULAR JOINT SURGERY      Current Medications: Current Meds  Medication Sig  . ANORO ELLIPTA 62.5-25 MCG/INH AEPB Inhale 1 Dose into the lungs daily.  [name] azithromycin (ZITHROMAX) 500 MG tablet Take 1 tablet by mouth 3 (three) times a week.  . Boron 3 MG CAPS Take by mouth daily.  . Calcium-Vitamin A-Vitamin D (LIQUID CALCIUM PO) Take 5 mLs by mouth daily.  [name] diltiazem (CARDIZEM CD) 120 MG 24 hr capsule TAKE 1 CAPSULE (120 MG TOTAL) BY MOUTH DAILY.  [name] diltiazem (CARDIZEM) 30  MG tablet TAKE 1 TABLET (30 MG TOTAL) BY MOUTH DAILY AS NEEDED. FOR PALPITATIONS  . glucosamine-chondroitin 500-400 MG tablet Take 1 tablet by mouth 3 (three) times daily.  . Grape Seed 100 MG CAPS Take by mouth 2 (two) times daily.  . Multiple Vitamins-Minerals (MULTIVITAMIN WITH MINERALS) tablet Take 1 tablet by mouth daily.  [name] Leaf Extract 150 MG CAPS Take by mouth daily.  . Quercetin (QUERCITIN) POWD by Does not apply route daily.  [name] thiamine (VITAMIN B-1) 100 MG tablet Take 100 mg by mouth daily.  [name] zolpidem (AMBIEN) 10  MG tablet Take 10 mg by mouth at bedtime as needed for sleep.     Allergies:   Amoxicillin; Septra [sulfamethoxazole-trimethoprim]; Augmentin [amoxicillin-pot clavulanate]; Latex; Levaquin [levofloxacin in d5w]; Levofloxacin; Penicillins; Sulfamethoxazole-trimethoprim; and Tetracycline   Social History   Socioeconomic History  . Marital status: Married    Spouse name: None  . Number of children: None  . Years of education: None  . Highest education level: None  Social Needs  . Financial resource strain: None  . Food insecurity - worry: None  . Food insecurity - inability: None  . Transportation needs - medical: None  . Transportation needs - non-medical: None  Occupational History  . None  Tobacco Use  . Smoking status: Never Smoker  . Smokeless tobacco: Never Used  Substance and Sexual Activity  . Alcohol use: No    Alcohol/week: 0.0 oz  . Drug use: No  . Sexual activity: None  Other Topics Concern  . None  Social History Narrative  . None      Family History:  Family History  Problem Relation Age of Onset  . Stroke Mother   . Cancer Father   . Hypertension Brother   . Cancer Maternal Grandmother   . Diabetes Paternal Grandmother     ROS:   Please see the history of present illness.  All other systems are reviewed and otherwise negative.    PHYSICAL EXAM:   VS:  BP 122/68   Pulse 77   Ht 5\' 7"  (1.702 m)   Wt 116 lb 8 oz (52.8 kg)   SpO2 99%   BMI 18.25 kg/m   BMI: Body mass index is 18.25 kg/m. GEN: Well nourished, well developed thin WF, in no acute distress  HEENT: normocephalic, atraumatic Neck: no JVD, carotid bruits, or masses Cardiac: RRR; no murmurs, rubs, or gallops, no edema  Respiratory:  clear to auscultation bilaterally, normal work of breathing GI: soft, nontender, nondistended, + BS MS: no deformity or atrophy  Skin: warm and dry, no rash Neuro:  Alert and Oriented x 3, Strength and sensation are intact, follows commands Psych:  euthymic mood, full affect  Wt Readings from Last 3 Encounters:  01/20/18 116 lb 8 oz (52.8 kg)  04/14/17 119 lb 6.4 oz (54.2 kg)  01/14/17 123 lb (55.8 kg)      Studies/Labs Reviewed:   EKG:  EKG was ordered today and personally reviewed by me and demonstrates NSR 68bpm RBBB no acute ST-T changes  Recent Labs: 04/14/2017: BUN 13; Creatinine, Ser 0.77; Magnesium 2.1; Potassium 4.2; Sodium 144; TSH 2.460   Lipid Panel No results found for: CHOL, TRIG, HDL, CHOLHDL, VLDL, LDLCALC, LDLDIRECT  Additional studies/ records that were reviewed today include: Summarized above.    ASSESSMENT & PLAN:   1. Palpitations with history of PACs, PVCs, nonsustained atrial tachycardia - she has been compliant with long acting cardizem but has not been taking the  PRN cardizem that was prescribed because she was afraid it would obscure the monitor results. Now that we have documented recurrent PACs and atrial tach, I have given her the go ahead to use this as needed to assess clinical response. She reports her lung disease is stage 1/mild and has no history of wheezing, so selective beta blocker could be considered if she does not find relief with diltiazem. Will also update basic labs to r/o metabolic contribution. Prior echo in 2017 for similar findings was normal 2. Atypical chest pain/fatigue/shortness of breath - patient's history of fibromyalgia makes this somewhat difficult to sort out. Given her age, h/o hyperlipidemia, atypical CP and fatigue, have recommended cardiac CT to further evaluate for any coronary disease. This will also allow [place] to evaluate lung fields. Since she is not wheezing, she is OK to receive the dose of Lopressor required for HR prior to the procedure. Initial HR coming in today was 77, repeat 68 with EKG. Will also check labs.. I have encouraged her to discuss her fatigue with PCP. Apparently she's been tried on a low dose of steroid for this as well so this has ongoing workup in  progress from other sources. Volume status looks stable. She is not tachycardic, tachypneic or hypoxic. 3. OSA - reports compliance with CPAP.  Disposition: F/u with Dr. Radford Pax or APP after completion of monitor and CT scan.   Medication Adjustments/Labs and Tests Ordered: Current medicines are reviewed at length with the patient today.  Concerns regarding medicines are outlined above. Medication changes, Labs and Tests ordered today are summarized above and listed in the Patient Instructions accessible in Encounters.   Signed, Charlie Pitter, PA-C  01/20/2018 9:09 AM    [facility] [address], [place], [place]  [address] Phone: [phone]; Fax: [phone]

## 2018-01-20 ENCOUNTER — Encounter: Payer: Self-pay | Admitting: Physician Assistant

## 2018-01-20 ENCOUNTER — Ambulatory Visit (INDEPENDENT_AMBULATORY_CARE_PROVIDER_SITE_OTHER): Payer: Medicare HMO | Admitting: Physician Assistant

## 2018-01-20 VITALS — BP 122/68 | HR 77 | Ht 67.0 in | Wt 116.5 lb

## 2018-01-20 DIAGNOSIS — I471 Supraventricular tachycardia: Secondary | ICD-10-CM | POA: Diagnosis not present

## 2018-01-20 DIAGNOSIS — R002 Palpitations: Secondary | ICD-10-CM

## 2018-01-20 DIAGNOSIS — G4733 Obstructive sleep apnea (adult) (pediatric): Secondary | ICD-10-CM

## 2018-01-20 DIAGNOSIS — R0602 Shortness of breath: Secondary | ICD-10-CM | POA: Diagnosis not present

## 2018-01-20 DIAGNOSIS — R5383 Other fatigue: Secondary | ICD-10-CM | POA: Diagnosis not present

## 2018-01-20 DIAGNOSIS — I491 Atrial premature depolarization: Secondary | ICD-10-CM | POA: Diagnosis not present

## 2018-01-20 DIAGNOSIS — R0789 Other chest pain: Secondary | ICD-10-CM | POA: Diagnosis not present

## 2018-01-20 DIAGNOSIS — I493 Ventricular premature depolarization: Secondary | ICD-10-CM

## 2018-01-20 LAB — CBC
HEMATOCRIT: 35.7 % (ref 34.0–46.6)
Hemoglobin: 12.2 g/dL (ref 11.1–15.9)
MCH: 29.6 pg (ref 26.6–33.0)
MCHC: 34.2 g/dL (ref 31.5–35.7)
MCV: 87 fL (ref 79–97)
Platelets: 313 10*3/uL (ref 150–379)
RBC: 4.12 x10E6/uL (ref 3.77–5.28)
RDW: 13.4 % (ref 12.3–15.4)
WBC: 6.9 10*3/uL (ref 3.4–10.8)

## 2018-01-20 LAB — BASIC METABOLIC PANEL
BUN/Creatinine Ratio: 14 (ref 12–28)
BUN: 12 mg/dL (ref 8–27)
CO2: 26 mmol/L (ref 20–29)
CREATININE: 0.87 mg/dL (ref 0.57–1.00)
Calcium: 9.9 mg/dL (ref 8.7–10.3)
Chloride: 103 mmol/L (ref 96–106)
GFR, EST AFRICAN AMERICAN: 79 mL/min/{1.73_m2} (ref >59)
GFR, EST NON AFRICAN AMERICAN: 69 mL/min/{1.73_m2} (ref >59)
Glucose: 87 mg/dL (ref 65–99)
Potassium: 4.5 mmol/L (ref 3.5–5.2)
Sodium: 143 mmol/L (ref 134–144)

## 2018-01-20 LAB — TSH: TSH: 3.16 u[IU]/mL (ref 0.450–4.500)

## 2018-01-20 LAB — MAGNESIUM: Magnesium: 2.3 mg/dL (ref 1.6–2.3)

## 2018-01-20 LAB — T4, FREE: Free T4: 1.08 ng/dL (ref 0.82–1.77)

## 2018-01-20 MED ORDER — METOPROLOL TARTRATE 50 MG PO TABS: 50.0000 mg | ORAL_TABLET | Freq: Once | ORAL | 0 refills | Status: DC

## 2018-01-20 NOTE — Patient Instructions (Addendum)
Medication Instructions:  Your physician recommends that you continue on your current medications as directed. Please refer to the Current Medication list given to you today.  Labwork: TODAY:  BMET, MAGNESIUM, CBC, TSH, & FREE T4  Testing/Procedures: Your physician has requested that you have cardiac CT. Cardiac computed tomography (CT) is a painless test that uses an x-ray machine to take clear, detailed pictures of your heart. For further information please visit [email or web address]. Please follow instruction sheet as given.   Follow-Up: Your physician recommends that you schedule a follow-up appointment in: WITH DR. Radford Pax OR APP AFTER THE CT   Any Other Special Instructions Will Be Listed Below (If Applicable).  Please arrive at the [facility] main entrance of [facility] at xx:xx AM (30-45 minutes prior to test start time)  [facility] [address] [place], [place] [address] [phone]  Proceed to the [facility] Radiology Department (First Floor).  Please follow these instructions carefully (unless otherwise directed):  Hold all erectile dysfunction medications at least 48 hours prior to test.  On the Night Before the Test: . Drink plenty of water. . Do not consume any caffeinated/decaffeinated beverages or chocolate 12 hours prior to your test. . Do not take any antihistamines 12 hours prior to your test.   On the Day of the Test: . Drink plenty of water. Do not drink any water within one hour of the test. . Do not eat any food 4 hours prior to the test. . You may take your regular medications prior to the test. . IF NOT ON A BETA BLOCKER - Take 50 mg of lopressor (metoprolol) one hour before the test.   After the Test: . Drink plenty of water. . After receiving IV contrast, you may experience a mild flushed feeling. This is normal. . On occasion, you may experience a mild rash up to 24 hours after the test. This is not dangerous. If this  occurs, you can take Benadryl 25 mg and increase your fluid intake. . If you experience trouble breathing, this can be serious. If it is severe call 911 IMMEDIATELY. If it is mild, please call our office. . If you take any of these medications: Glipizide/Metformin, Avandament, Glucavance, please do not take 48 hours after completing test.      If you need a refill on your cardiac medications before your next appointment, please call your pharmacy.

## 2018-01-21 NOTE — Progress Notes (Signed)
Pt has been made aware of normal result and verbalized understanding.  jw 01/21/18

## 2018-01-24 DIAGNOSIS — H9042 Sensorineural hearing loss, unilateral, left ear, with unrestricted hearing on the contralateral side: Secondary | ICD-10-CM | POA: Diagnosis not present

## 2018-01-27 ENCOUNTER — Telehealth: Payer: Self-pay | Admitting: Physician Assistant

## 2018-01-27 DIAGNOSIS — R5381 Other malaise: Secondary | ICD-10-CM | POA: Diagnosis not present

## 2018-01-27 DIAGNOSIS — E049 Nontoxic goiter, unspecified: Secondary | ICD-10-CM | POA: Diagnosis not present

## 2018-01-27 DIAGNOSIS — M810 Age-related osteoporosis without current pathological fracture: Secondary | ICD-10-CM | POA: Diagnosis not present

## 2018-01-27 DIAGNOSIS — N959 Unspecified menopausal and perimenopausal disorder: Secondary | ICD-10-CM | POA: Diagnosis not present

## 2018-01-27 NOTE — Telephone Encounter (Signed)
Patient having reaction to body guardian electrodes.  She has tried the waterproof patch, the sensitive skin patch, and the football base with sensitive skin electrodes.  Offered another type (Soft-e) of electrode to try with the football base but informed her they do not stick very well if she is warm or active.  Patient choose to hold off at this time.  She is alternating her sites and believes she may be able to finish her two weeks.  Informed patient if she wanted to take a couple of days off just call [employer] to inform them.  Patient was thankful for the information.

## 2018-01-27 NOTE — Telephone Encounter (Signed)
New message  Pt verbalized that she is calling for the RN/Shelly  She said that she is having an reactions to the Holter monitor   Pt skin is broken out and she has called the company to get a different kind  The company said that they don't have anything  The pt do not want to take it off she wants to finish her two weeks

## 2018-02-02 DIAGNOSIS — G4733 Obstructive sleep apnea (adult) (pediatric): Secondary | ICD-10-CM | POA: Diagnosis not present

## 2018-02-08 DIAGNOSIS — G4733 Obstructive sleep apnea (adult) (pediatric): Secondary | ICD-10-CM | POA: Diagnosis not present

## 2018-02-11 DIAGNOSIS — H524 Presbyopia: Secondary | ICD-10-CM | POA: Diagnosis not present

## 2018-02-21 ENCOUNTER — Telehealth: Payer: Self-pay

## 2018-02-21 DIAGNOSIS — J441 Chronic obstructive pulmonary disease with (acute) exacerbation: Secondary | ICD-10-CM | POA: Diagnosis not present

## 2018-02-21 DIAGNOSIS — J449 Chronic obstructive pulmonary disease, unspecified: Secondary | ICD-10-CM | POA: Diagnosis not present

## 2018-02-21 DIAGNOSIS — R932 Abnormal findings on diagnostic imaging of liver and biliary tract: Secondary | ICD-10-CM | POA: Diagnosis not present

## 2018-02-21 DIAGNOSIS — E8801 Alpha-1-antitrypsin deficiency: Secondary | ICD-10-CM | POA: Diagnosis not present

## 2018-02-21 DIAGNOSIS — J301 Allergic rhinitis due to pollen: Secondary | ICD-10-CM | POA: Diagnosis not present

## 2018-02-21 DIAGNOSIS — J479 Bronchiectasis, uncomplicated: Secondary | ICD-10-CM | POA: Diagnosis not present

## 2018-02-21 DIAGNOSIS — R5382 Chronic fatigue, unspecified: Secondary | ICD-10-CM | POA: Diagnosis not present

## 2018-02-21 MED ORDER — DILTIAZEM HCL ER COATED BEADS 180 MG PO CP24: 180.0000 mg | ORAL_CAPSULE | Freq: Every day | ORAL | 3 refills | Status: DC

## 2018-02-21 NOTE — Telephone Encounter (Signed)
Notes recorded by Teressa Senter, RN on 02/21/2018 at 3:49 PM EDT Patient made aware of event monitor results and Dr. Theodosia Blender recommendation to increase Cardizem to 180 mg once a day due to atrial tachycardia. Patient is scheduled with Ellen Henri, PA on 03/22/18 (first available). Patient in agreement with treatment plan and thankful for the call.  Notes recorded by Sueanne Margarita, MD on 02/21/2018 at 9:57 AM EDT Heart monitor showed PACs and atrial tachycardia but no afib - please increase Cardizem CD to 180mg  daily and followup with extender in 3 weeks

## 2018-02-23 ENCOUNTER — Telehealth: Payer: Self-pay | Admitting: Physician Assistant

## 2018-02-23 NOTE — Telephone Encounter (Signed)
Spoke with patient at length answering her many questions r/t the Coronary CT she is scheduled for on Friday.  Reviewed instructions including to take Metoprolol tablet 1 hour before her testing.  She also asked about the type of clothing she should wear and how long the IV will be in.  Answered all questions r/t CT.  She also had more questions r/t her monitor.  States she was driving yesterday and was taken off guard when she received the call about her results.  Reviewed results of monitor - PAC and atrial tachycardia, no A Fib.  Advised the increase in Cardizem should help with the palpitations she has been feeling.  Pt thanked me for taking time to answer all of her questions.

## 2018-02-23 NOTE — Telephone Encounter (Signed)
New Message  Pt states she has question regarding her ct that she has scheduled. Please call

## 2018-02-25 ENCOUNTER — Ambulatory Visit (HOSPITAL_COMMUNITY)
Admission: RE | Admit: 2018-02-25 | Discharge: 2018-02-25 | Disposition: A | Discharge status: Home / Self Care | Payer: Medicare HMO | Source: Ambulatory Visit | Attending: Physician Assistant | Admitting: Physician Assistant

## 2018-02-25 ENCOUNTER — Ambulatory Visit (HOSPITAL_COMMUNITY): Admission: RE | Admit: 2018-02-25 | Payer: Medicare HMO | Source: Ambulatory Visit

## 2018-02-25 DIAGNOSIS — R5383 Other fatigue: Secondary | ICD-10-CM | POA: Diagnosis not present

## 2018-02-25 DIAGNOSIS — J984 Other disorders of lung: Secondary | ICD-10-CM | POA: Insufficient documentation

## 2018-02-25 DIAGNOSIS — R0600 Dyspnea, unspecified: Secondary | ICD-10-CM | POA: Diagnosis not present

## 2018-02-25 DIAGNOSIS — R0789 Other chest pain: Secondary | ICD-10-CM | POA: Diagnosis present

## 2018-02-25 DIAGNOSIS — R079 Chest pain, unspecified: Secondary | ICD-10-CM | POA: Diagnosis not present

## 2018-02-25 MED ORDER — IOPAMIDOL (ISOVUE-370) INJECTION 76%
INTRAVENOUS | Status: AC
  Administered 2018-02-25: 80 mL
  Filled 2018-02-25: qty 100

## 2018-02-25 MED ORDER — NITROGLYCERIN 0.4 MG SL SUBL
SUBLINGUAL_TABLET | SUBLINGUAL | Status: DC
Start: 2018-02-25 — End: 2018-02-26
  Filled 2018-02-25: qty 1

## 2018-02-28 ENCOUNTER — Telehealth: Payer: Self-pay | Admitting: Physician Assistant

## 2018-02-28 NOTE — Telephone Encounter (Signed)
Close encounter 

## 2018-03-09 ENCOUNTER — Other Ambulatory Visit: Payer: Self-pay | Admitting: Family Medicine

## 2018-03-09 DIAGNOSIS — E041 Nontoxic single thyroid nodule: Secondary | ICD-10-CM

## 2018-03-11 ENCOUNTER — Encounter: Payer: Self-pay | Admitting: Cardiology

## 2018-03-22 ENCOUNTER — Ambulatory Visit (INDEPENDENT_AMBULATORY_CARE_PROVIDER_SITE_OTHER): Payer: Medicare HMO | Admitting: Cardiology

## 2018-03-22 ENCOUNTER — Encounter (INDEPENDENT_AMBULATORY_CARE_PROVIDER_SITE_OTHER): Payer: Self-pay

## 2018-03-22 ENCOUNTER — Encounter: Payer: Self-pay | Admitting: Cardiology

## 2018-03-22 VITALS — BP 108/72 | HR 70 | Ht 67.0 in | Wt 116.8 lb

## 2018-03-22 DIAGNOSIS — I491 Atrial premature depolarization: Secondary | ICD-10-CM

## 2018-03-22 MED ORDER — DILTIAZEM HCL 30 MG PO TABS: 30.0000 mg | ORAL_TABLET | Freq: Every day | ORAL | 3 refills | Status: DC | PRN

## 2018-03-22 MED ORDER — DILTIAZEM HCL ER COATED BEADS 180 MG PO CP24: 180.0000 mg | ORAL_CAPSULE | Freq: Every day | ORAL | 3 refills | Status: DC

## 2018-03-22 NOTE — Progress Notes (Signed)
03/22/2018 Michele Jones   1951/01/04  [id number]  Primary Physician Via, Lennette Bihari, MD Primary Cardiologist: Dr. Radford Pax  Reason for Visit/CC: Follow-up for palpitations and atypical chest pain  HPI: Michele Jones is a 67 y.o. female with history of symptomatic PVC's and PAC's, nonsustained atrial tachycardia, mild OSA on CPAP, diastolic dysfunction (not seen on 03-24-2016 echo), RBBB, alpha trypsin 1 def and COPD with bronchiectasis, chronic shortness of breath and fibromyalgia who presents back to clinic for follow-up after recent evaluation for palpitations and atypical chest pain.  She was seen by Sharrell Ku in February 2019 for these complaints and she was ordered to get an outpatient monitor as well as a coronary CTA with calcium.  She also had laboratory work including thyroid function studies, CBC, basic metabolic panel and magnesium which were all within normal limits.  Cardiac monitor showed PACs and atrial tachycardia however no atrial fibrillation was noted.  She was instructed by Dr. Radford Pax to increase her daily scheduled Cardizem to 180 mg daily. CTA was a normal study.  Coronary artery calcium score was 0 suggesting low risk for future cardiac events.  There was no plaque or stenosis noted in the coronary arteries.   Today in follow-up she reports that she has done fairly well with the higher dose of cardizem. No recurrent palpitations. HR and BP are both stable.    Cardiac Studies: Coronary CTA w/ Calcium 03/24/18  FINDINGS: Non-cardiac: See separate report from [facility].  Calcium Score: 0 Agatston units.  Coronary Arteries: Right dominant with no anomalies  LM: No plaque or stenosis.  LAD system: No plaque or stenosis.  Circumflex system: Relatively small vessel.  No plaque or stenosis.  RCA system: No plaque or stenosis  IMPRESSION: 1. Coronary artery calcium score 0 Agatston units, suggesting low risk for future cardiac events.  2.  No plaque  or stenosis noted in the coronary arteries.    Current Meds  Medication Sig  . Albuterol Sulfate (PROAIR RESPICLICK) 824 (90 Base) MCG/ACT AEPB Inhale 1 Dose into the lungs every 6 (six) hours as needed.  Jearl Klinefelter ELLIPTA 62.5-25 MCG/INH AEPB Inhale 1 Dose into the lungs daily.  [name] azithromycin (ZITHROMAX) 500 MG tablet Take 1 tablet by mouth 3 (three) times a week.  [name] b complex vitamins tablet Take 1 tablet by mouth daily.  . Boron 3 MG CAPS Take by mouth daily.  . Calcium-Vitamin A-Vitamin D (LIQUID CALCIUM PO) Take 5 mLs by mouth daily.  [name] diltiazem (CARDIZEM CD) 180 MG 24 hr capsule Take 1 capsule (180 mg total) by mouth daily.  [name] diltiazem (CARDIZEM) 30 MG tablet TAKE 1 TABLET (30 MG TOTAL) BY MOUTH DAILY AS NEEDED. FOR PALPITATIONS  . glucosamine-chondroitin 500-400 MG tablet Take 1 tablet by mouth 3 (three) times daily.  . Grape Seed 100 MG CAPS Take by mouth 2 (two) times daily.  . Multiple Vitamins-Minerals (MULTIVITAMIN WITH MINERALS) tablet Take 1 tablet by mouth daily.  [name] Leaf Extract 150 MG CAPS Take by mouth daily.  . Quercetin (QUERCITIN) POWD Take 1 capsule by mouth daily.   [name] zolpidem (AMBIEN) 10 MG tablet Take 10 mg by mouth at bedtime as needed for sleep.   Allergies  Allergen Reactions  . Amoxicillin Rash    Thinks it may cause a rash.   Sarina Ill [Sulfamethoxazole-Trimethoprim] Rash    Thinks it may cause a rash.   . Augmentin [Amoxicillin-Pot Clavulanate] Rash  . Latex Rash    unknown  .  Levaquin [Levofloxacin In D5w] Rash  . Levofloxacin Rash  . Penicillins Other (See Comments)    unknown  . Sulfamethoxazole-Trimethoprim Rash  . Tetracycline Other (See Comments)    unknown   Past Medical History:  Diagnosis Date  . Allergy   . Bronchiectasis ([place])   . COPD (chronic obstructive pulmonary disease) ([facility])    alpha 1 antitrypsin deficiency  . Fibromyalgia   . Malaria   . OSA (obstructive sleep apnea)    mild with AHI 5.8/hr now on CPAP at 10cm H2o    . Osteoporosis   . PAC (premature atrial contraction)   . Paroxysmal atrial tachycardia ([place])   . PVC's (premature ventricular contractions)   . RBBB    no ischemia on nuclear stree test   Family History  Problem Relation Age of Onset  . Stroke Mother   . Cancer Father   . Hypertension Brother   . Cancer Maternal Grandmother   . Diabetes Paternal Grandmother    Past Surgical History:  Procedure Laterality Date  . APPENDECTOMY    . TEMPOROMANDIBULAR JOINT SURGERY     Social History   Socioeconomic History  . Marital status: Married    Spouse name: Not on file  . Number of children: Not on file  . Years of education: Not on file  . Highest education level: Not on file  Occupational History  . Not on file  Social Needs  . Financial resource strain: Not on file  . Food insecurity:    Worry: Not on file    Inability: Not on file  . Transportation needs:    Medical: Not on file    Non-medical: Not on file  Tobacco Use  . Smoking status: Never Smoker  . Smokeless tobacco: Never Used  Substance and Sexual Activity  . Alcohol use: No    Alcohol/week: 0.0 oz  . Drug use: No  . Sexual activity: Not on file  Lifestyle  . Physical activity:    Days per week: Not on file    Minutes per session: Not on file  . Stress: Not on file  Relationships  . Social connections:    Talks on phone: Not on file    Gets together: Not on file    Attends religious service: Not on file    Active member of club or organization: Not on file    Attends meetings of clubs or organizations: Not on file    Relationship status: Not on file  . Intimate partner violence:    Fear of current or ex partner: Not on file    Emotionally abused: Not on file    Physically abused: Not on file    Forced sexual activity: Not on file  Other Topics Concern  . Not on file  Social History Narrative  . Not on file     Review of Systems: General: negative for chills, fever, night sweats or weight  changes.  Cardiovascular: negative for chest pain, dyspnea on exertion, edema, orthopnea, palpitations, paroxysmal nocturnal dyspnea or shortness of breath Dermatological: negative for rash Respiratory: negative for cough or wheezing Urologic: negative for hematuria Abdominal: negative for nausea, vomiting, diarrhea, bright red blood per rectum, melena, or hematemesis Neurologic: negative for visual changes, syncope, or dizziness All other systems reviewed and are otherwise negative except as noted above.   Physical Exam:  Blood pressure 108/72, pulse 70, height 5\' 7"  (1.702 m), weight 116 lb 12.8 oz (53 kg), SpO2 98 %.  General appearance: alert,  cooperative and no distress Neck: no carotid bruit and no JVD Lungs: clear to auscultation bilaterally Heart: regular rate and rhythm, S1, S2 normal, no murmur, click, rub or gallop Extremities: extremities normal, atraumatic, no cyanosis or edema Pulses: 2+ and symmetric Skin: Skin color, texture, turgor normal. No rashes or lesions Neurologic: Grossly normal  EKG not performed -- personally reviewed   ASSESSMENT AND PLAN:   1.  Palpitations: laboratory work including thyroid function studies, CBC, basic metabolic panel and magnesium were all within normal limits. Cardiac monitor showed PACs and atrial tachycardia however no atrial fibrillation was noted.  She was instructed by Dr. Radford Pax to increase her daily scheduled Cardizem to 180 mg daily. She notes improvement with reduction in symptoms.   2.  Atypical chest pain: CTA was a normal study.  Coronary artery calcium score was 0 suggesting low risk for future cardiac events.  There was no plaque or stenosis noted in the coronary arteries. Pt reassured.   3.  Obstructive sleep apnea: She reports full compliance with CPAP. She is due for f/u in sleep clinic with Dr. Radford Pax.   Follow-Up w/ Dr. Radford Pax in sleep clinic for management of OSA.   Avelyn Touch Ladoris Gene, MHS [facility]  [facility] 03/22/2018 8:39 AM

## 2018-03-22 NOTE — Patient Instructions (Addendum)
Medication Instructions:  Your physician recommends that you continue on your current medications as directed. Please refer to the Current Medication list given to you today.   Labwork: None ordered  Testing/Procedures: None ordered  Follow-Up: Your physician recommends that you schedule a follow-up appointment in: DR. TURNER 1ST AVAILABLE ([place])   Any Other Special Instructions Will Be Listed Below (If Applicable).     If you need a refill on your cardiac medications before your next appointment, please call your pharmacy.   Premature Atrial Contraction A premature atrial contraction [facility]) is a kind of irregular heartbeat (arrhythmia). It happens when the heart beats too early and then pauses before beating again. PACs are also called skipped heartbeats because they may make you feel like your heart is stopping for a second, even though the heart does not actually skip a beat. The heart has four areas, or chambers. Normally, electrical signals spread across the heart and make all the chambers beat together. During a PAC, the upper chambers of the heart (right atrium and left atrium) beat too early, before they have had time to fill with blood. The heartbeat pauses afterward so the heart can fill with blood for the next beat. What are the causes? The cause of this condition is often unknown. Sometimes it is caused by heart disease or injury to the heart. What increases the risk? This condition is more likely to develop in adults who are 89 years of age or older and in children. Episodes may be triggered by:  Caffeine.  Stress.  Tiredness.  Alcohol.  Smoking.  Stimulant drugs.  Heart disease.  What are the signs or symptoms? Symptoms of this condition include:  A feeling that your heart skipped a beat. The first heartbeat after the "skipped" beat may feel more forceful.  A feeling that your heart is fluttering.  How is this diagnosed? This condition is  diagnosed based on:  Your symptoms.  A physical exam. Your health care provider may listen to your heart.  Tests to rule out other conditions, such as a test that records the electrical impulses of the heart and assesses heart health (electrocardiogram, or ECG). If you have an ECG, you may need to wear a portable ECG machine (Holter monitor) that records your heart beats for 24 hours or more.  How is this treated?  Usually, treatment is not needed for this condition. If you have episodes that happen often or if a cause is found, you may receive treatment for the underlying cause of your PACs. Follow these instructions at home: Lifestyle Follow these instructions as told by your health care provider:  Do not use any products that contain nicotine or tobacco, such as cigarettes and e-cigarettes. If you need help quitting, ask your health care provider.  If caffeine triggers episodes, do not eat, drink, or use anything with caffeine in it.  If caffeine does not seem to trigger episodes, consume caffeine in moderation.  If alcohol triggers episodes of PAC, do not drink alcohol.  If alcohol does not seem to trigger episodes, limit alcohol intake to no more than 1 drink a day for nonpregnant women and 2 drinks a day for men. One drink equals 12 oz of beer, 5 oz of wine, or 1 oz of hard liquor.  Exercise regularly. Ask your health care provider what type of exercise is safe for you.  Find healthy ways to manage stress. Avoid stressful situations when possible.  Try to get at least 7-9  hours of sleep each night, or as much as recommended by your health care provider.  Do not use illegal drugs.  General instructions  Take over-the-counter and prescription medicines only as told by your health care provider.  Keep all follow-up visits as told by your health care provider. This is important. Contact a health care provider if:  You feel your heart skipping beats more than once a  day.  Your heart skips beats and you feel dizzy, light-headed, or very tired. Get help right away if:  You have chest pain.  You have trouble breathing. This information is not intended to replace advice given to you by your health care provider. Make sure you discuss any questions you have with your health care provider. Document Released: 07/20/2014 Document Revised: 07/14/2016 Document Reviewed: 05/15/2016 Elsevier Interactive Patient Education  [employer].

## 2018-03-25 DIAGNOSIS — G4733 Obstructive sleep apnea (adult) (pediatric): Secondary | ICD-10-CM | POA: Diagnosis not present

## 2018-04-21 ENCOUNTER — Other Ambulatory Visit (HOSPITAL_COMMUNITY): Payer: Self-pay | Admitting: Internal Medicine

## 2018-04-21 DIAGNOSIS — Z148 Genetic carrier of other disease: Secondary | ICD-10-CM

## 2018-04-21 DIAGNOSIS — R932 Abnormal findings on diagnostic imaging of liver and biliary tract: Secondary | ICD-10-CM

## 2018-05-04 ENCOUNTER — Ambulatory Visit (HOSPITAL_COMMUNITY)
Admission: RE | Admit: 2018-05-04 | Discharge: 2018-05-04 | Disposition: A | Discharge status: Home / Self Care | Payer: Medicare HMO | Source: Ambulatory Visit | Attending: Internal Medicine | Admitting: Internal Medicine

## 2018-05-04 DIAGNOSIS — Z148 Genetic carrier of other disease: Secondary | ICD-10-CM

## 2018-05-04 DIAGNOSIS — E8801 Alpha-1-antitrypsin deficiency: Secondary | ICD-10-CM | POA: Diagnosis not present

## 2018-05-04 DIAGNOSIS — R932 Abnormal findings on diagnostic imaging of liver and biliary tract: Secondary | ICD-10-CM | POA: Diagnosis not present

## 2018-05-04 DIAGNOSIS — K7689 Other specified diseases of liver: Secondary | ICD-10-CM | POA: Diagnosis not present

## 2018-05-18 ENCOUNTER — Encounter: Payer: Self-pay | Admitting: Cardiology

## 2018-05-20 ENCOUNTER — Encounter: Payer: Self-pay | Admitting: Cardiology

## 2018-05-20 ENCOUNTER — Ambulatory Visit (INDEPENDENT_AMBULATORY_CARE_PROVIDER_SITE_OTHER): Payer: Medicare HMO | Admitting: Cardiology

## 2018-05-20 VITALS — BP 130/62 | HR 70 | Ht 67.0 in | Wt 114.0 lb

## 2018-05-20 DIAGNOSIS — K219 Gastro-esophageal reflux disease without esophagitis: Secondary | ICD-10-CM | POA: Diagnosis not present

## 2018-05-20 DIAGNOSIS — G4733 Obstructive sleep apnea (adult) (pediatric): Secondary | ICD-10-CM | POA: Diagnosis not present

## 2018-05-20 DIAGNOSIS — I493 Ventricular premature depolarization: Secondary | ICD-10-CM | POA: Diagnosis not present

## 2018-05-20 DIAGNOSIS — I471 Supraventricular tachycardia: Secondary | ICD-10-CM | POA: Diagnosis not present

## 2018-05-20 DIAGNOSIS — I4719 Other supraventricular tachycardia: Secondary | ICD-10-CM

## 2018-05-20 MED ORDER — PANTOPRAZOLE SODIUM 40 MG PO TBEC: 40.0000 mg | DELAYED_RELEASE_TABLET | Freq: Every day | ORAL | 11 refills | Status: DC

## 2018-05-20 NOTE — Patient Instructions (Signed)
Medication Instructions:  Your physician has recommended you make the following change in your medication:  START: Protonix 40 mg once a day   If you need a refill on your cardiac medications, please contact your pharmacy first.  Labwork: None ordered   Testing/Procedures: None ordered   Follow-Up: You have been referred to Dr. Earlean Shawl office for GERD   Your physician wants you to follow-up in: 1 year with Dr. Radford Pax. You will receive a reminder letter in the mail two months in advance. If you don't receive a letter, please call our office to schedule the follow-up appointment.  Any Other Special Instructions Will Be Listed Below (If Applicable). CPAP decreased to 8 cm water pressure and Dr. Radford Pax will get a download in 2 weeks   Pantoprazole oral suspension What is this medicine? PANTOPRAZOLE (pan TOE pra zole) prevents the production of acid in the stomach. It is used to treat gastroesophageal reflux disease (GERD), inflammation of the esophagus, and Zollinger-Ellison syndrome. This medicine may be used for other purposes; ask your health care provider or pharmacist if you have questions. COMMON BRAND NAME(S): Protonix What should I tell my health care provider before I take this medicine? They need to know if you have any of these conditions: -liver disease -low levels of magnesium in the blood -lupus -an unusual or allergic reaction to omeprazole, lansoprazole, pantoprazole, rabeprazole, other medicines, foods, dyes, or preservatives -pregnant or trying to get pregnant -breast-feeding How should I use this medicine? Take this medicine by mouth. Follow the directions on the prescription label. To take this medicine, you can sprinkle the granules on a teaspoonful of applesauce and swallow. Or, you can put the granules in a small cup and mix with a teaspoonful of apple juice. Stir for 5 seconds and drink. Rinse the cup at least once with more apple juice and drink it to be sure you  have taken your full dose. Mix your medicine in applesauce or apple juice ONLY. Do not mix with water or any other liquids or foods. Take your dose 30 minutes before a meal. Take your medicine at regular intervals. Do not take your medicine more often than directed. Take all of your medicine as directed even if you think you are better. Do not skip doses or stop your medicine early. Talk to your pediatrician regarding the use of this medicine in children. While this drug may be prescribed for children as young as 5 years for selected conditions, precautions do apply. Overdosage: If you think you have taken too much of this medicine contact a poison control center or emergency room at once. NOTE: This medicine is only for you. Do not share this medicine with others. What if I miss a dose? If you miss a dose, take it as soon as you can. If it is almost time for your next dose, take only that dose. Do not take double or extra doses. What may interact with this medicine? Do not take this medicine with any of the following medications: -atazanavir -nelfinavir This medicine may also interact with the following medications: -ampicillin -delavirdine -erlotinib -iron salts -medicines for fungal infections like ketoconazole, itraconazole and voriconazole -methotrexate -mycophenolate mofetil -warfarin This list may not describe all possible interactions. Give your health care provider a list of all the medicines, herbs, non-prescription drugs, or dietary supplements you use. Also tell them if you smoke, drink alcohol, or use illegal drugs. Some items may interact with your medicine. What should I watch for while using  this medicine? It can take several days before your stomach pain gets better. Check with your doctor or health care professional if your condition does not start to get better, or if it gets worse. You may need blood work done while you are taking this medicine. What side effects may I  notice from receiving this medicine? Side effects that you should report to your doctor or health care professional as soon as possible: -allergic reactions like skin rash, itching or hives, swelling of the face, lips, or tongue -bone, muscle or joint pain -breathing problems -chest pain or chest tightness -dark yellow or brown urine -dizziness -fast, irregular heartbeat -feeling faint or lightheaded -fever or sore throat -muscle spasm -palpitations -rash on cheeks or arms that gets worse in the sun -redness, blistering, peeling or loosening of the skin, including inside the mouth -seizures -tremors -unusual bleeding or bruising -unusually weak or tired -yellowing of the eyes or skin Side effects that usually do not require medical attention (report to your doctor or health care professional if they continue or are bothersome): -constipation -diarrhea -dry mouth -headache -nausea This list may not describe all possible side effects. Call your doctor for medical advice about side effects. You may report side effects to FDA at 1-800-FDA-1088. Where should I keep my medicine? Keep out of the reach of children. Store at room temperature between 15 and 30 degrees C (59 and 86 degrees F). Protect from light and moisture. Throw away any unused medicine after the expiration date. NOTE: This sheet is a summary. It may not cover all possible information. If you have questions about this medicine, talk to your doctor, pharmacist, or health care provider.  2018 Elsevier/Gold Standard (2015-12-19 15:56:11)    Thank you for choosing [facility], RN  [phone]  If you need a refill on your cardiac medications before your next appointment, please call your pharmacy.

## 2018-05-20 NOTE — Progress Notes (Signed)
Cardiology Office Note:    Date:  05/20/2018   ID:  Michele Jones, DOB December 25, 1950, MRN [id number]  PCP:  Dineen Kid, MD  Cardiologist:  No primary care provider on file.    Referring MD: Dineen Kid, MD   Chief Complaint  Patient presents with  . Sleep Apnea    History of Present Illness:    Michele Jones is a 67 y.o. female with a hx of symptomatic PVC's and PAC's, nonsustained atrial tachycardia, mild OSA on CPAP and diastolic dysfunction.  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 She has a history of RBBB and nuclear stress test 11/2016 was normal. 2D echo 2017 showed normal LVF .    She is here today for followup and is doing well.  She denies any chest pain or pressure, SOB, DOE, PND, orthopnea, LE edema, dizziness, palpitations or syncope. She is compliant with her meds and is tolerating meds with no SE. since going up on her Cardizem her palpitations have significantly improved.  She is doing well with her CPAP device.  She tolerates the mask but feels that time that she is getting air through her nose and her mouth it is blowing her cheeks out.  She is having some problems though with dry mouth and is worried that she might be sleeping with her mouth open some.   She continues to feel rested in the am and has no significant daytime sleepiness.  She does not think that she snores.  She says she has been having some problems with waking up in the middle the night with burning all the way up into her throat.  She also has to clear her throat constantly throughout the day.  She has had some reflux in the past and her allergist thought she probably had reflux causing her symptoms.   Past Medical History:  Diagnosis Date  . Allergy   . Bronchiectasis ([place])   . COPD (chronic obstructive pulmonary disease) (HCC)    alpha 1 antitrypsin deficiency  . Fibromyalgia   . Malaria   . OSA (obstructive  sleep apnea)    mild with AHI 5.8/hr now on CPAP at 10cm H2o  . Osteoporosis   . PAC (premature atrial contraction)   . Paroxysmal atrial tachycardia ([place])   . PVC's (premature ventricular contractions)   . RBBB    no ischemia on nuclear stree test    Past Surgical History:  Procedure Laterality Date  . APPENDECTOMY    . TEMPOROMANDIBULAR JOINT SURGERY      Current Medications: Current Meds  Medication Sig  . Albuterol Sulfate (PROAIR RESPICLICK) 300 (90 Base) MCG/ACT AEPB Inhale 1 Dose into the lungs every 6 (six) hours as needed.  Jearl Klinefelter ELLIPTA 62.5-25 MCG/INH AEPB Inhale 1 Dose into the lungs daily.  [name] azithromycin (ZITHROMAX) 500 MG tablet Take 1 tablet by mouth 3 (three) times a week.  [name] b complex vitamins tablet Take 1 tablet by mouth daily.  . Boron 3 MG CAPS Take by mouth daily.  . Calcium-Vitamin A-Vitamin D (LIQUID CALCIUM PO) Take 5 mLs by mouth daily.  [name] diltiazem (CARDIZEM CD) 180 MG 24 hr capsule Take 1 capsule (180 mg total) by mouth daily.  [name] diltiazem (CARDIZEM) 30 MG tablet Take 1 tablet (30 mg total) by mouth daily as needed. For palpitations  . glucosamine-chondroitin 500-400 MG tablet Take 1 tablet by mouth 3 (three)  times daily.  . Grape Seed 100 MG CAPS Take by mouth 2 (two) times daily.  . Multiple Vitamins-Minerals (MULTIVITAMIN WITH MINERALS) tablet Take 1 tablet by mouth daily.  [name] Leaf Extract 150 MG CAPS Take by mouth daily.  . Quercetin (QUERCITIN) POWD Take 1 capsule by mouth daily.   [name] zolpidem (AMBIEN) 10 MG tablet Take 10 mg by mouth at bedtime as needed for sleep.     Allergies:   Amoxicillin; Molds & smuts; Septra [sulfamethoxazole-trimethoprim]; Augmentin [amoxicillin-pot clavulanate]; Latex; Levaquin [levofloxacin in d5w]; Levofloxacin; Penicillins; Sulfamethoxazole-trimethoprim; and Tetracycline   Social History   Socioeconomic History  . Marital status: Married    Spouse name: Not on file  . Number of children: Not on file  .  Years of education: Not on file  . Highest education level: Not on file  Occupational History  . Not on file  Social Needs  . Financial resource strain: Not on file  . Food insecurity:    Worry: Not on file    Inability: Not on file  . Transportation needs:    Medical: Not on file    Non-medical: Not on file  Tobacco Use  . Smoking status: Never Smoker  . Smokeless tobacco: Never Used  Substance and Sexual Activity  . Alcohol use: No    Alcohol/week: 0.0 oz  . Drug use: No  . Sexual activity: Not on file  Lifestyle  . Physical activity:    Days per week: Not on file    Minutes per session: Not on file  . Stress: Not on file  Relationships  . Social connections:    Talks on phone: Not on file    Gets together: Not on file    Attends religious service: Not on file    Active member of club or organization: Not on file    Attends meetings of clubs or organizations: Not on file    Relationship status: Not on file  Other Topics Concern  . Not on file  Social History Narrative  . Not on file     Family History: The patient's family history includes Cancer in her father and maternal grandmother; Diabetes in her paternal grandmother; Hypertension in her brother; Stroke in her mother.  ROS:   Please see the history of present illness.    ROS  All other systems reviewed and negative.   EKGs/Labs/Other Studies Reviewed:    The following studies were reviewed today: PAP download  EKG:  EKG is not ordered today.   Recent Labs: 01/20/2018: BUN 12; Creatinine, Ser 0.87; Hemoglobin 12.2; Magnesium 2.3; Platelets 313; Potassium 4.5; Sodium 143; TSH 3.160   Recent Lipid Panel No results found for: CHOL, TRIG, HDL, CHOLHDL, VLDL, LDLCALC, LDLDIRECT  Physical Exam:    VS:  BP 130/62   Pulse 70   Ht 5\' 7"  (1.702 m)   Wt 114 lb (51.7 kg)   SpO2 99%   BMI 17.85 kg/m     Wt Readings from Last 3 Encounters:  05/20/18 114 lb (51.7 kg)  03/22/18 116 lb 12.8 oz (53 kg)    01/20/18 116 lb 8 oz (52.8 kg)     GEN:  Well nourished, well developed in no acute distress HEENT: Normal NECK: No JVD; No carotid bruits LYMPHATICS: No lymphadenopathy CARDIAC: RRR, no murmurs, rubs, gallops RESPIRATORY:  Clear to auscultation without rales, wheezing or rhonchi  ABDOMEN: Soft, non-tender, non-distended MUSCULOSKELETAL:  No edema; No deformity  SKIN: Warm and dry NEUROLOGIC:  Alert  and oriented x 3 PSYCHIATRIC:  Normal affect   ASSESSMENT:    1. OSA (obstructive sleep apnea)   2. Paroxysmal atrial tachycardia ([place])   3. PVC's (premature ventricular contractions)   4. Gastroesophageal reflux disease without esophagitis    PLAN:    In order of problems listed above:  1.  OSA - the patient is tolerating PAP therapy well without any problems. The PAP download was reviewed today and showed an AHI of 0.2/hr on 10 cm H2O with 97% compliance in using more than 4 hours nightly.  The patient has been using and benefiting from PAP use and will continue to benefit from therapy.  I have recommended that we add a chinstrap as well as decrease her CPAP to 8 cm's H2O.  Hopefully this will help with not getting as much air in her mouth and also help with dry mouth.  Get a download in 2 weeks.  2.  PAT -she has not had any further problems with palpitations.  She will continue on Cardizem CD 180 mg daily  3.  PVCs  -these are suppressed on Cardizem.  4.  GERD -he is not having some burning in her chest up into her throat at night as well as significant throat clearing constantly throughout the day.  I suspect this is all related to reflux.  I will start her on Protonix 40 mg daily and get her back into see Dr. Anselmo Rod who she is seen before.   Medication Adjustments/Labs and Tests Ordered: Current medicines are reviewed at length with the patient today.  Concerns regarding medicines are outlined above.  Orders Placed This Encounter  Procedures  . DME Other see comment   Meds  ordered this encounter  Medications  . pantoprazole (PROTONIX) 40 MG tablet    Sig: Take 1 tablet (40 mg total) by mouth daily.    Dispense:  30 tablet    Refill:  11    Signed, Fransico Him, MD  05/20/2018 12:51 PM    [place]

## 2018-05-24 DIAGNOSIS — K219 Gastro-esophageal reflux disease without esophagitis: Secondary | ICD-10-CM | POA: Diagnosis not present

## 2018-06-08 ENCOUNTER — Telehealth: Payer: Self-pay | Admitting: Cardiology

## 2018-06-08 NOTE — Telephone Encounter (Signed)
She needs to see her PCP regarding her GERD

## 2018-06-08 NOTE — Telephone Encounter (Signed)
Patient wonders if she went back to 10 cm H20 it would help her GERD. Patient states her GERD is not getting better but not any worse.. She is doing everything she is suppose to do and taking her medication. She did have so SOB and is still clearing her throat of phlegm. Please advise

## 2018-06-08 NOTE — Telephone Encounter (Signed)
New message:       Pt states her sleep apnea pressure # and she wants to discuss this.

## 2018-06-10 NOTE — Telephone Encounter (Signed)
Patient is making some changes in her eating habits including what she eats and what time she eats and has put a wedge under her bed to keep her head up. Patient wants to know if Dr Radford Pax thinks turning her unit back to 10 cm would make a difference in helping the GERD.

## 2018-06-10 NOTE — Telephone Encounter (Signed)
Her PAP was supposed to be decreased to 8cm H2O at last OV and then get a download 2 weeks later

## 2018-06-13 NOTE — Telephone Encounter (Signed)
Yes it was but she thinks turning it back to 8 might help her GERD. Patient was advised to see her PCP about her GERD.

## 2018-06-30 DIAGNOSIS — G4733 Obstructive sleep apnea (adult) (pediatric): Secondary | ICD-10-CM | POA: Diagnosis not present

## 2018-07-06 DIAGNOSIS — G4733 Obstructive sleep apnea (adult) (pediatric): Secondary | ICD-10-CM | POA: Diagnosis not present

## 2018-07-28 DIAGNOSIS — E049 Nontoxic goiter, unspecified: Secondary | ICD-10-CM | POA: Diagnosis not present

## 2018-07-28 DIAGNOSIS — N951 Menopausal and female climacteric states: Secondary | ICD-10-CM | POA: Diagnosis not present

## 2018-07-28 DIAGNOSIS — R5381 Other malaise: Secondary | ICD-10-CM | POA: Diagnosis not present

## 2018-07-28 DIAGNOSIS — D509 Iron deficiency anemia, unspecified: Secondary | ICD-10-CM | POA: Diagnosis not present

## 2018-07-29 ENCOUNTER — Ambulatory Visit
Admission: RE | Admit: 2018-07-29 | Discharge: 2018-07-29 | Disposition: A | Discharge status: Home / Self Care | Payer: Medicare HMO | Source: Ambulatory Visit | Attending: Family Medicine | Admitting: Family Medicine

## 2018-07-29 DIAGNOSIS — E0789 Other specified disorders of thyroid: Secondary | ICD-10-CM | POA: Diagnosis not present

## 2018-07-29 DIAGNOSIS — E041 Nontoxic single thyroid nodule: Secondary | ICD-10-CM

## 2018-08-03 DIAGNOSIS — M810 Age-related osteoporosis without current pathological fracture: Secondary | ICD-10-CM | POA: Diagnosis not present

## 2018-08-03 DIAGNOSIS — E048 Other specified nontoxic goiter: Secondary | ICD-10-CM | POA: Diagnosis not present

## 2018-08-03 DIAGNOSIS — G4733 Obstructive sleep apnea (adult) (pediatric): Secondary | ICD-10-CM | POA: Diagnosis not present

## 2018-08-03 DIAGNOSIS — M797 Fibromyalgia: Secondary | ICD-10-CM | POA: Diagnosis not present

## 2018-08-03 DIAGNOSIS — Z23 Encounter for immunization: Secondary | ICD-10-CM | POA: Diagnosis not present

## 2018-08-03 DIAGNOSIS — J449 Chronic obstructive pulmonary disease, unspecified: Secondary | ICD-10-CM | POA: Diagnosis not present

## 2018-08-03 DIAGNOSIS — K219 Gastro-esophageal reflux disease without esophagitis: Secondary | ICD-10-CM | POA: Diagnosis not present

## 2018-08-03 DIAGNOSIS — Z681 Body mass index (BMI) 19 or less, adult: Secondary | ICD-10-CM | POA: Diagnosis not present

## 2018-08-03 DIAGNOSIS — E8801 Alpha-1-antitrypsin deficiency: Secondary | ICD-10-CM | POA: Diagnosis not present

## 2018-08-09 DIAGNOSIS — R69 Illness, unspecified: Secondary | ICD-10-CM | POA: Diagnosis not present

## 2018-08-18 DIAGNOSIS — Z01419 Encounter for gynecological examination (general) (routine) without abnormal findings: Secondary | ICD-10-CM | POA: Diagnosis not present

## 2018-09-08 DIAGNOSIS — R5381 Other malaise: Secondary | ICD-10-CM | POA: Diagnosis not present

## 2018-10-10 DIAGNOSIS — G4733 Obstructive sleep apnea (adult) (pediatric): Secondary | ICD-10-CM | POA: Diagnosis not present

## 2018-10-20 DIAGNOSIS — N951 Menopausal and female climacteric states: Secondary | ICD-10-CM | POA: Diagnosis not present

## 2018-10-20 DIAGNOSIS — R5381 Other malaise: Secondary | ICD-10-CM | POA: Diagnosis not present

## 2018-12-07 DIAGNOSIS — J069 Acute upper respiratory infection, unspecified: Secondary | ICD-10-CM | POA: Diagnosis not present

## 2018-12-07 DIAGNOSIS — Z148 Genetic carrier of other disease: Secondary | ICD-10-CM | POA: Diagnosis not present

## 2018-12-07 DIAGNOSIS — J029 Acute pharyngitis, unspecified: Secondary | ICD-10-CM | POA: Diagnosis not present

## 2019-01-12 DIAGNOSIS — G4733 Obstructive sleep apnea (adult) (pediatric): Secondary | ICD-10-CM | POA: Diagnosis not present

## 2019-01-25 DIAGNOSIS — H9191 Unspecified hearing loss, right ear: Secondary | ICD-10-CM | POA: Diagnosis not present

## 2019-01-25 DIAGNOSIS — H9042 Sensorineural hearing loss, unilateral, left ear, with unrestricted hearing on the contralateral side: Secondary | ICD-10-CM | POA: Diagnosis not present

## 2019-02-01 DIAGNOSIS — Z23 Encounter for immunization: Secondary | ICD-10-CM | POA: Diagnosis not present

## 2019-02-10 DIAGNOSIS — L720 Epidermal cyst: Secondary | ICD-10-CM | POA: Diagnosis not present

## 2019-02-10 DIAGNOSIS — L820 Inflamed seborrheic keratosis: Secondary | ICD-10-CM | POA: Diagnosis not present

## 2019-02-10 DIAGNOSIS — L821 Other seborrheic keratosis: Secondary | ICD-10-CM | POA: Diagnosis not present

## 2019-03-01 DIAGNOSIS — G4733 Obstructive sleep apnea (adult) (pediatric): Secondary | ICD-10-CM | POA: Diagnosis not present

## 2019-03-16 DIAGNOSIS — J441 Chronic obstructive pulmonary disease with (acute) exacerbation: Secondary | ICD-10-CM | POA: Diagnosis not present

## 2019-03-16 DIAGNOSIS — J449 Chronic obstructive pulmonary disease, unspecified: Secondary | ICD-10-CM | POA: Diagnosis not present

## 2019-04-12 ENCOUNTER — Other Ambulatory Visit: Payer: Self-pay | Admitting: Cardiology

## 2019-05-12 ENCOUNTER — Other Ambulatory Visit: Payer: Self-pay | Admitting: Cardiology

## 2019-05-30 DIAGNOSIS — G4733 Obstructive sleep apnea (adult) (pediatric): Secondary | ICD-10-CM | POA: Diagnosis not present

## 2019-06-05 ENCOUNTER — Ambulatory Visit: Payer: Medicare HMO | Admitting: Cardiology

## 2019-06-07 ENCOUNTER — Other Ambulatory Visit: Payer: Self-pay | Admitting: *Deleted

## 2019-06-07 DIAGNOSIS — M79641 Pain in right hand: Secondary | ICD-10-CM | POA: Diagnosis not present

## 2019-06-07 DIAGNOSIS — M79642 Pain in left hand: Secondary | ICD-10-CM | POA: Diagnosis not present

## 2019-06-07 DIAGNOSIS — Z20822 Contact with and (suspected) exposure to covid-19: Secondary | ICD-10-CM

## 2019-06-07 DIAGNOSIS — R6889 Other general symptoms and signs: Secondary | ICD-10-CM | POA: Diagnosis not present

## 2019-06-07 DIAGNOSIS — M1990 Unspecified osteoarthritis, unspecified site: Secondary | ICD-10-CM | POA: Diagnosis not present

## 2019-06-12 LAB — NOVEL CORONAVIRUS, NAA: SARS-CoV-2, NAA: NOT DETECTED

## 2019-06-15 ENCOUNTER — Telehealth: Payer: Self-pay | Admitting: Family Medicine

## 2019-06-15 NOTE — Telephone Encounter (Signed)
Patient called in for the results of her Covid test.  She was told that Covid was Not Detected.

## 2019-06-23 ENCOUNTER — Telehealth: Payer: Self-pay

## 2019-06-23 NOTE — Telephone Encounter (Signed)
Left message regarding appt on 06/26/19. 

## 2019-06-26 ENCOUNTER — Encounter: Payer: Self-pay | Admitting: Cardiology

## 2019-06-26 ENCOUNTER — Telehealth (INDEPENDENT_AMBULATORY_CARE_PROVIDER_SITE_OTHER): Payer: Medicare HMO | Admitting: Cardiology

## 2019-06-26 ENCOUNTER — Other Ambulatory Visit: Payer: Self-pay

## 2019-06-26 VITALS — BP 132/66 | HR 73 | Ht 67.0 in | Wt 110.0 lb

## 2019-06-26 DIAGNOSIS — I493 Ventricular premature depolarization: Secondary | ICD-10-CM

## 2019-06-26 DIAGNOSIS — G4733 Obstructive sleep apnea (adult) (pediatric): Secondary | ICD-10-CM

## 2019-06-26 DIAGNOSIS — I471 Supraventricular tachycardia: Secondary | ICD-10-CM | POA: Diagnosis not present

## 2019-06-26 NOTE — Telephone Encounter (Signed)
Patient returned your call.

## 2019-06-26 NOTE — Progress Notes (Signed)
Virtual Visit via Video Note   This visit type was conducted due to national recommendations for restrictions regarding the COVID-19 Pandemic (e.g. social distancing) in an effort to limit this patient's exposure and mitigate transmission in our community.  Due to her co-morbid illnesses, this patient is at least at moderate risk for complications without adequate follow up.  This format is felt to be most appropriate for this patient at this time.  All issues noted in this document were discussed and addressed.  A limited physical exam was performed with this format.  Please refer to the patient's chart for her consent to telehealth for [name].  Evaluation Performed:  Follow-up visit  This visit type was conducted due to national recommendations for restrictions regarding the COVID-19 Pandemic (e.g. social distancing).  This format is felt to be most appropriate for this patient at this time.  All issues noted in this document were discussed and addressed.  No physical exam was performed (except for noted visual exam findings with Video Visits).  Please refer to the patient's chart (MyChart message for video visits and phone note for telephone visits) for the patient's consent to telehealth for [facility].  Date:  06/26/2019   ID:  Michele Jones, DOB Aug 16, 1951, MRN [id number]  Patient Location:  Home  Provider location:   [place]  PCP:  ViaLennette Bihari, MD  Cardiologist:  Fransico Him, MD Electrophysiologist:  None   Chief Complaint:  OSA  History of Present Illness:    Michele Jones is a 68 y.o. female who presents via audio/video conferencing for a telehealth visit today.    Michele Jones is a 68 y.o. 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 She has a  history ofRBBB and nuclear stress test1/2018was normal. 2D echo 2017 showed normal LVF .    She is here today for followup and is doing well.  She has chronic SOB which has worsened recently due to a flare up of her bronchiectasis.  Along with this she has noticed some increased skipping of her heart beat once daily.  It lasts 3 seconds and resolves.  She has not had any sustained palpitations.  She denies any chest pain or pressure, PND, orthopnea, dizziness or syncope. She had some LE edema last month for a day but had salted her food that day before.  She is compliant with her meds and is tolerating meds with no SE.    She is doing well with her CPAP device and thinks that she has gotten used to it.  She tolerates the mask and feels the pressure is adequate.  Since going on CPAP she feels rested most of the time in the am and has no significant daytime sleepiness.  She continues to have significant dry mouth but get claustrophobic with the chin strap.  She does not think that he snores.    The patient does not have symptoms concerning for COVID-19 infection (fever, chills, cough, or new shortness of breath).    Prior CV studies:   The following studies were reviewed today:  PAP compliance download  Past Medical History:  Diagnosis Date  . Allergy   . Bronchiectasis ([place])   . COPD (chronic obstructive pulmonary disease) (HCC)    alpha 1 antitrypsin deficiency  . Fibromyalgia   . Malaria   .  OSA (obstructive sleep apnea)    mild with AHI 5.8/hr now on CPAP at 10cm H2o  . Osteoporosis   . PAC (premature atrial contraction)   . Paroxysmal atrial tachycardia ([place])   . PVC's (premature ventricular contractions)   . RBBB    no ischemia on nuclear stree test   Past Surgical History:  Procedure Laterality Date  . APPENDECTOMY    . TEMPOROMANDIBULAR JOINT SURGERY       Current Meds  Medication Sig  . ANORO ELLIPTA 62.5-25 MCG/INH AEPB Inhale 1 Dose into the lungs daily.  [name]  azithromycin (ZITHROMAX) 500 MG tablet Take 1 tablet by mouth 3 (three) times a week.  [name] b complex vitamins tablet Take 1 tablet by mouth daily.  . Boron 3 MG CAPS Take by mouth daily.  . Calcium-Vitamin A-Vitamin D (LIQUID CALCIUM PO) Take 5 mLs by mouth daily.  . ciprofloxacin (CIPRO) 750 MG tablet Take 1 tablet by mouth 2 (two) times a day.  . diltiazem (CARDIZEM CD) 180 MG 24 hr capsule TAKE 1 CAPSULE BY MOUTH EVERY DAY  . diltiazem (CARDIZEM) 30 MG tablet TAKE 1 TABLET (30 MG TOTAL) BY MOUTH DAILY AS NEEDED. FOR PALPITATIONS  . glucosamine-chondroitin 500-400 MG tablet Take 1 tablet by mouth daily.   . Grape Seed 100 MG CAPS Take by mouth 2 (two) times daily.  . Multiple Vitamins-Minerals (MULTIVITAMIN WITH MINERALS) tablet Take 1 tablet by mouth daily.  [name] Leaf Extract 150 MG CAPS Take by mouth daily.  [name] zolpidem (AMBIEN) 10 MG tablet Take 10 mg by mouth at bedtime as needed for sleep.  . [DISCONTINUED] Quercetin (QUERCITIN) POWD Take 1 capsule by mouth daily.      Allergies:   Amoxicillin, Molds & smuts, Septra [sulfamethoxazole-trimethoprim], Augmentin [amoxicillin-pot clavulanate], Latex, Levaquin [levofloxacin in d5w], Levofloxacin, Penicillins, Sulfamethoxazole-trimethoprim, and Tetracycline   Social History   Tobacco Use  . Smoking status: Never Smoker  . Smokeless tobacco: Never Used  Substance Use Topics  . Alcohol use: No    Alcohol/week: 0.0 standard drinks  . Drug use: No     Family Hx: The patient's family history includes Cancer in her father and maternal grandmother; Diabetes in her paternal grandmother; Hypertension in her brother; Stroke in her mother.  ROS:   Please see the history of present illness.     All other systems reviewed and are negative.   Labs/Other Tests and Data Reviewed:    Recent Labs: No results found for requested labs within last 8760 hours.   Recent Lipid Panel No results found for: CHOL, TRIG, HDL, CHOLHDL, LDLCALC,  LDLDIRECT  Wt Readings from Last 3 Encounters:  06/26/19 110 lb (49.9 kg)  05/20/18 114 lb (51.7 kg)  03/22/18 116 lb 12.8 oz (53 kg)     Objective:    Vital Signs:  BP 132/66   Pulse 73   Ht 5\' 7"  (1.702 m)   Wt 110 lb (49.9 kg)   BMI 17.23 kg/m    CONSTITUTIONAL:  Well nourished, well developed female in no acute distress.  EYES: anicteric MOUTH: oral mucosa is pink RESPIRATORY: Normal respiratory effort, symmetric expansion CARDIOVASCULAR: No peripheral edema SKIN: No rash, lesions or ulcers MUSCULOSKELETAL: no digital cyanosis NEURO: Cranial Nerves II-XII grossly intact, moves all extremities PSYCH: Intact judgement and insight.  A&O x 3, Mood/affect appropriate   ASSESSMENT & PLAN:    1.  OSA - the patient is tolerating PAP therapy well without any problems. The PAP download was reviewed  today and showed an AHI of 0.2/hr on 10 cm H2O with 100% compliance in using more than 4 hours nightly.  The patient has been using and benefiting from PAP use and will continue to benefit from therapy.   2.  PAT -she denies any palpitations since I saw her last -continue on Cardizem CD 180mg  daily -I encouraged her to use the short acting Cardizem 30mg  PRN with palpitations.  She already has a a Rx for this  3.  PVCs -well suppressed on Cardizem  COVID-19 Education: The signs and symptoms of COVID-19 were discussed with the patient and how to seek care for testing (follow up with PCP or arrange E-visit).  The importance of social distancing was discussed today.  Patient Risk:   After full review of this patient's clinical status, I feel that they are at least moderate risk at this time.  Time:   Today, I have spent 20 minutes directly with the patient on telemedicine discussing medical problems including OSA, PAT, PVCs.  We also reviewed the symptoms of COVID 19 and the ways to protect against contracting the virus with telehealth technology.  I spent an additional 5 minutes  reviewing patient's chart including office visit notes.  Medication Adjustments/Labs and Tests Ordered: Current medicines are reviewed at length with the patient today.  Concerns regarding medicines are outlined above.  Tests Ordered: No orders of the defined types were placed in this encounter.  Medication Changes: No orders of the defined types were placed in this encounter.   Disposition:  Follow up in 1 year(s)  Signed, Fransico Him, MD  06/26/2019 11:32 AM    [facility]

## 2019-06-26 NOTE — Patient Instructions (Signed)
Medication Instructions:  Your provider recommends that you continue on your current medications as directed. Please refer to the Current Medication list given to you today.   If you need a refill on your cardiac medications before your next appointment, please call your pharmacy.   Lab work: None  Testing/Procedures:  Follow-Up: At [facility], you and your health needs are our priority.  As part of our continuing mission to provide you with exceptional heart care, we have created designated Provider Care Teams.  These Care Teams include your primary Cardiologist (physician) and Advanced Practice Providers (APPs -  Physician Assistants and Nurse Practitioners) who all work together to provide you with the care you need, when you need it. You will need a follow up appointment in 12 months.  Please call our office 2 months in advance to schedule this appointment.  You may see Dr. Radford Pax or one of the following Advanced Practice Providers on your designated Care Team:   [place], PA-C Melina Copa, PA-C . Ermalinda Barrios, PA-C

## 2019-08-02 DIAGNOSIS — E7849 Other hyperlipidemia: Secondary | ICD-10-CM | POA: Diagnosis not present

## 2019-08-02 DIAGNOSIS — R209 Unspecified disturbances of skin sensation: Secondary | ICD-10-CM | POA: Diagnosis not present

## 2019-08-02 DIAGNOSIS — M810 Age-related osteoporosis without current pathological fracture: Secondary | ICD-10-CM | POA: Diagnosis not present

## 2019-08-02 DIAGNOSIS — I7300 Raynaud's syndrome without gangrene: Secondary | ICD-10-CM | POA: Diagnosis not present

## 2019-08-02 DIAGNOSIS — E049 Nontoxic goiter, unspecified: Secondary | ICD-10-CM | POA: Diagnosis not present

## 2019-08-09 DIAGNOSIS — K219 Gastro-esophageal reflux disease without esophagitis: Secondary | ICD-10-CM | POA: Diagnosis not present

## 2019-08-09 DIAGNOSIS — G4733 Obstructive sleep apnea (adult) (pediatric): Secondary | ICD-10-CM | POA: Diagnosis not present

## 2019-08-09 DIAGNOSIS — E785 Hyperlipidemia, unspecified: Secondary | ICD-10-CM | POA: Diagnosis not present

## 2019-08-09 DIAGNOSIS — I4510 Unspecified right bundle-branch block: Secondary | ICD-10-CM | POA: Diagnosis not present

## 2019-08-09 DIAGNOSIS — E049 Nontoxic goiter, unspecified: Secondary | ICD-10-CM | POA: Diagnosis not present

## 2019-08-09 DIAGNOSIS — M797 Fibromyalgia: Secondary | ICD-10-CM | POA: Diagnosis not present

## 2019-08-09 DIAGNOSIS — M810 Age-related osteoporosis without current pathological fracture: Secondary | ICD-10-CM | POA: Diagnosis not present

## 2019-08-09 DIAGNOSIS — J479 Bronchiectasis, uncomplicated: Secondary | ICD-10-CM | POA: Diagnosis not present

## 2019-08-09 DIAGNOSIS — J449 Chronic obstructive pulmonary disease, unspecified: Secondary | ICD-10-CM | POA: Diagnosis not present

## 2019-08-09 DIAGNOSIS — I471 Supraventricular tachycardia: Secondary | ICD-10-CM | POA: Diagnosis not present

## 2019-08-16 DIAGNOSIS — K219 Gastro-esophageal reflux disease without esophagitis: Secondary | ICD-10-CM | POA: Diagnosis not present

## 2019-08-16 DIAGNOSIS — Z800 Family history of malignant neoplasm of digestive organs: Secondary | ICD-10-CM | POA: Insufficient documentation

## 2019-08-29 DIAGNOSIS — G4733 Obstructive sleep apnea (adult) (pediatric): Secondary | ICD-10-CM | POA: Diagnosis not present

## 2019-09-27 DIAGNOSIS — E8801 Alpha-1-antitrypsin deficiency: Secondary | ICD-10-CM | POA: Diagnosis not present

## 2019-09-27 DIAGNOSIS — M2550 Pain in unspecified joint: Secondary | ICD-10-CM | POA: Diagnosis not present

## 2019-09-28 DIAGNOSIS — G4733 Obstructive sleep apnea (adult) (pediatric): Secondary | ICD-10-CM | POA: Diagnosis not present

## 2019-10-05 DIAGNOSIS — Z0000 Encounter for general adult medical examination without abnormal findings: Secondary | ICD-10-CM | POA: Diagnosis not present

## 2019-10-10 DIAGNOSIS — H259 Unspecified age-related cataract: Secondary | ICD-10-CM | POA: Diagnosis not present

## 2019-10-10 DIAGNOSIS — Z135 Encounter for screening for eye and ear disorders: Secondary | ICD-10-CM | POA: Diagnosis not present

## 2019-10-10 DIAGNOSIS — Z0100 Encounter for examination of eyes and vision without abnormal findings: Secondary | ICD-10-CM | POA: Diagnosis not present

## 2019-10-10 DIAGNOSIS — H04123 Dry eye syndrome of bilateral lacrimal glands: Secondary | ICD-10-CM | POA: Diagnosis not present

## 2019-10-10 DIAGNOSIS — H5210 Myopia, unspecified eye: Secondary | ICD-10-CM | POA: Diagnosis not present

## 2019-10-17 ENCOUNTER — Other Ambulatory Visit: Payer: Self-pay | Admitting: Family Medicine

## 2019-10-17 DIAGNOSIS — E049 Nontoxic goiter, unspecified: Secondary | ICD-10-CM

## 2019-10-17 DIAGNOSIS — E041 Nontoxic single thyroid nodule: Secondary | ICD-10-CM

## 2019-10-29 DIAGNOSIS — G4733 Obstructive sleep apnea (adult) (pediatric): Secondary | ICD-10-CM | POA: Diagnosis not present

## 2019-10-31 ENCOUNTER — Other Ambulatory Visit: Payer: Self-pay | Admitting: Cardiology

## 2019-10-31 DIAGNOSIS — Z1231 Encounter for screening mammogram for malignant neoplasm of breast: Secondary | ICD-10-CM

## 2019-11-27 DIAGNOSIS — G4733 Obstructive sleep apnea (adult) (pediatric): Secondary | ICD-10-CM | POA: Diagnosis not present

## 2019-12-06 DIAGNOSIS — R5383 Other fatigue: Secondary | ICD-10-CM | POA: Diagnosis not present

## 2019-12-06 DIAGNOSIS — R768 Other specified abnormal immunological findings in serum: Secondary | ICD-10-CM | POA: Diagnosis not present

## 2019-12-06 DIAGNOSIS — M150 Primary generalized (osteo)arthritis: Secondary | ICD-10-CM | POA: Diagnosis not present

## 2019-12-06 DIAGNOSIS — M3500 Sicca syndrome, unspecified: Secondary | ICD-10-CM | POA: Diagnosis not present

## 2019-12-06 DIAGNOSIS — M2550 Pain in unspecified joint: Secondary | ICD-10-CM | POA: Diagnosis not present

## 2019-12-06 DIAGNOSIS — Z681 Body mass index (BMI) 19 or less, adult: Secondary | ICD-10-CM | POA: Diagnosis not present

## 2019-12-06 DIAGNOSIS — R350 Frequency of micturition: Secondary | ICD-10-CM | POA: Diagnosis not present

## 2019-12-08 DIAGNOSIS — G4733 Obstructive sleep apnea (adult) (pediatric): Secondary | ICD-10-CM | POA: Diagnosis not present

## 2019-12-13 DIAGNOSIS — G4733 Obstructive sleep apnea (adult) (pediatric): Secondary | ICD-10-CM | POA: Diagnosis not present

## 2019-12-28 DIAGNOSIS — G4733 Obstructive sleep apnea (adult) (pediatric): Secondary | ICD-10-CM | POA: Diagnosis not present

## 2020-01-07 ENCOUNTER — Ambulatory Visit: Payer: Medicare HMO

## 2020-01-08 DIAGNOSIS — G4733 Obstructive sleep apnea (adult) (pediatric): Secondary | ICD-10-CM | POA: Diagnosis not present

## 2020-01-28 ENCOUNTER — Ambulatory Visit: Payer: Medicare HMO

## 2020-01-28 DIAGNOSIS — G4733 Obstructive sleep apnea (adult) (pediatric): Secondary | ICD-10-CM | POA: Diagnosis not present

## 2020-02-05 DIAGNOSIS — G4733 Obstructive sleep apnea (adult) (pediatric): Secondary | ICD-10-CM | POA: Diagnosis not present

## 2020-02-17 ENCOUNTER — Other Ambulatory Visit: Payer: Self-pay | Admitting: Cardiology

## 2020-02-19 DIAGNOSIS — H903 Sensorineural hearing loss, bilateral: Secondary | ICD-10-CM | POA: Diagnosis not present

## 2020-02-19 DIAGNOSIS — H8110 Benign paroxysmal vertigo, unspecified ear: Secondary | ICD-10-CM | POA: Diagnosis not present

## 2020-02-19 DIAGNOSIS — H9113 Presbycusis, bilateral: Secondary | ICD-10-CM | POA: Diagnosis not present

## 2020-02-19 DIAGNOSIS — H6980 Other specified disorders of Eustachian tube, unspecified ear: Secondary | ICD-10-CM | POA: Diagnosis not present

## 2020-02-21 DIAGNOSIS — R32 Unspecified urinary incontinence: Secondary | ICD-10-CM | POA: Diagnosis not present

## 2020-02-21 DIAGNOSIS — Z800 Family history of malignant neoplasm of digestive organs: Secondary | ICD-10-CM | POA: Diagnosis not present

## 2020-02-21 DIAGNOSIS — R159 Full incontinence of feces: Secondary | ICD-10-CM | POA: Diagnosis not present

## 2020-02-21 DIAGNOSIS — Z1211 Encounter for screening for malignant neoplasm of colon: Secondary | ICD-10-CM | POA: Diagnosis not present

## 2020-02-21 DIAGNOSIS — K219 Gastro-esophageal reflux disease without esophagitis: Secondary | ICD-10-CM | POA: Diagnosis not present

## 2020-02-21 DIAGNOSIS — K5900 Constipation, unspecified: Secondary | ICD-10-CM | POA: Diagnosis not present

## 2020-02-23 DIAGNOSIS — Z1152 Encounter for screening for COVID-19: Secondary | ICD-10-CM | POA: Diagnosis not present

## 2020-02-29 DIAGNOSIS — Z800 Family history of malignant neoplasm of digestive organs: Secondary | ICD-10-CM | POA: Diagnosis not present

## 2020-02-29 DIAGNOSIS — K449 Diaphragmatic hernia without obstruction or gangrene: Secondary | ICD-10-CM | POA: Diagnosis not present

## 2020-02-29 DIAGNOSIS — Z1211 Encounter for screening for malignant neoplasm of colon: Secondary | ICD-10-CM | POA: Diagnosis not present

## 2020-02-29 DIAGNOSIS — K219 Gastro-esophageal reflux disease without esophagitis: Secondary | ICD-10-CM | POA: Diagnosis not present

## 2020-03-11 ENCOUNTER — Ambulatory Visit
Admission: RE | Admit: 2020-03-11 | Discharge: 2020-03-11 | Disposition: A | Discharge status: Home / Self Care | Payer: Medicare HMO | Source: Ambulatory Visit | Attending: Family Medicine | Admitting: Family Medicine

## 2020-03-11 DIAGNOSIS — E049 Nontoxic goiter, unspecified: Secondary | ICD-10-CM

## 2020-03-11 DIAGNOSIS — E042 Nontoxic multinodular goiter: Secondary | ICD-10-CM | POA: Diagnosis not present

## 2020-03-11 DIAGNOSIS — E041 Nontoxic single thyroid nodule: Secondary | ICD-10-CM

## 2020-03-11 IMAGING — US US THYROID
1 series · 13 of 25 positions shown · non-contrast
Comparison: [DATE]

CLINICAL DATA: Nontoxic uninodular goiter.  Enlargement of thyroid.

EXAM:
THYROID ULTRASOUND
TECHNIQUE: Ultrasound examination of the thyroid gland and adjacent soft
tissues was performed.

[Series 1: us thyroid · 0.04mm/px · 13 of 43 slices shown]
[im 1/43]
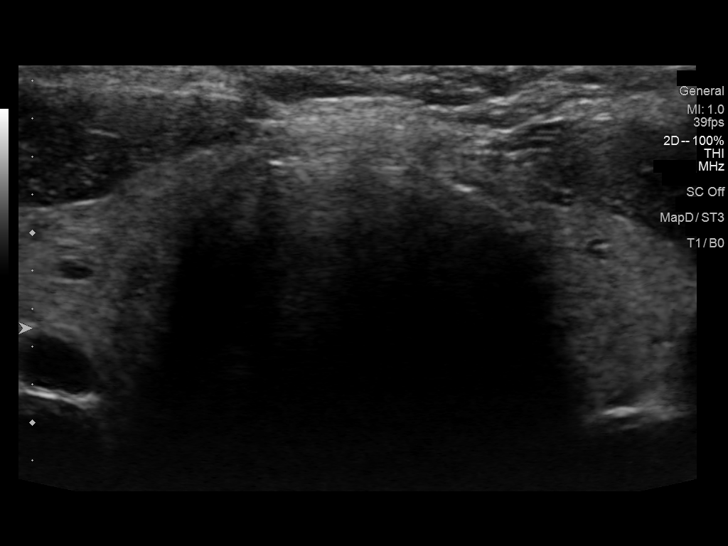
[im 4/43]
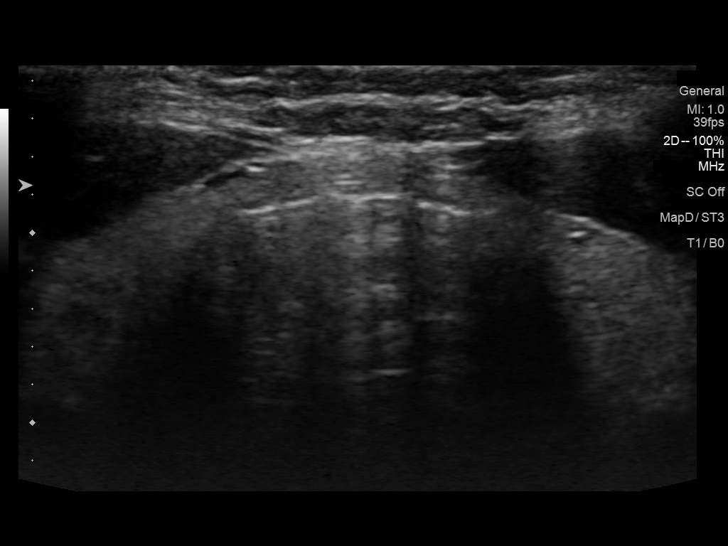
[im 8/43]
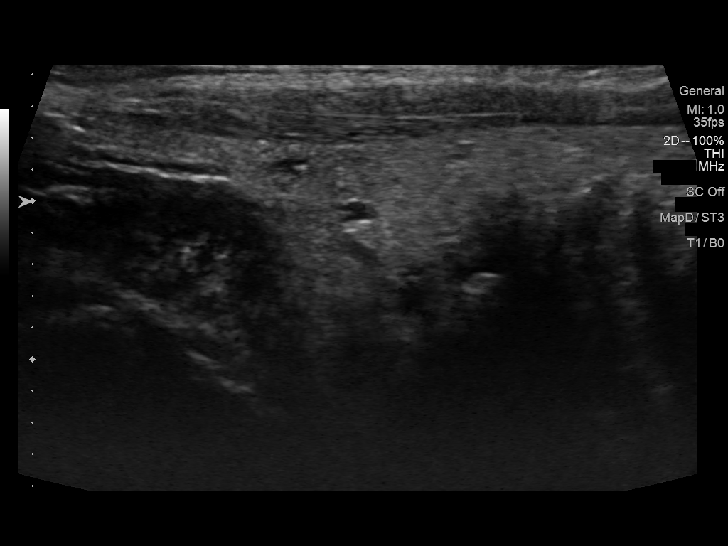
[im 11/43]
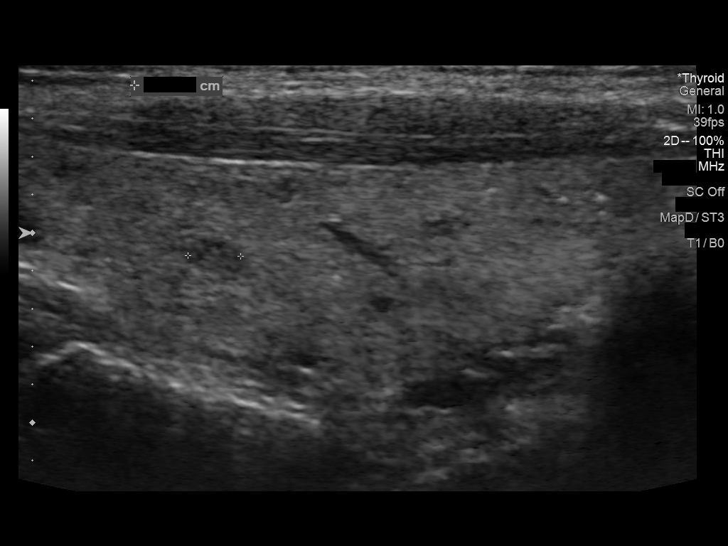
[im 15/43]
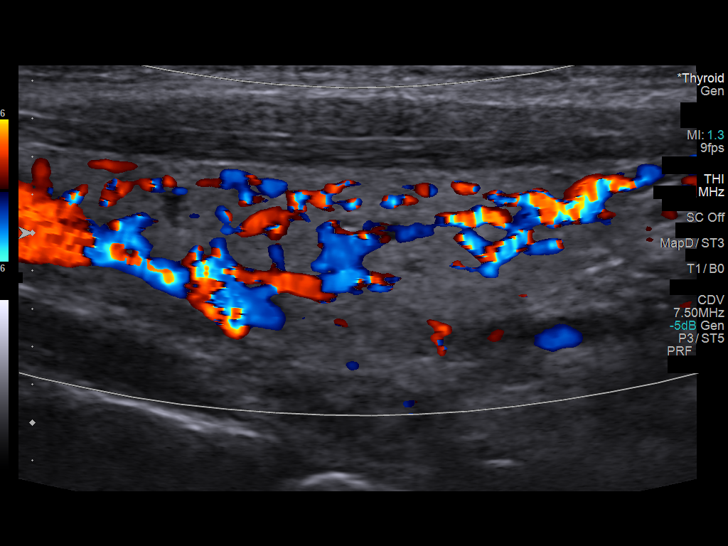
[im 18/43]
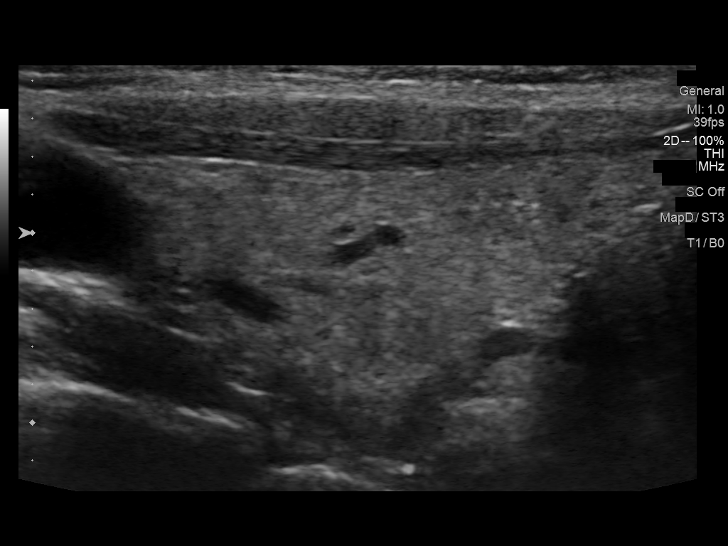
[im 22/43]
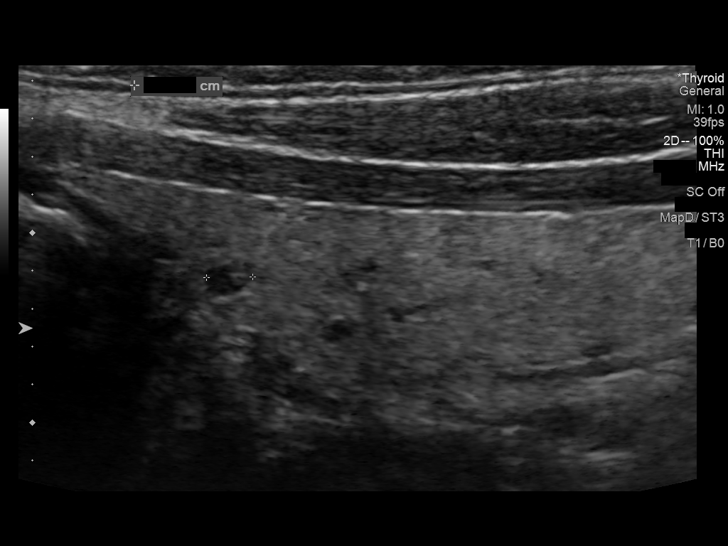
[im 25/43]
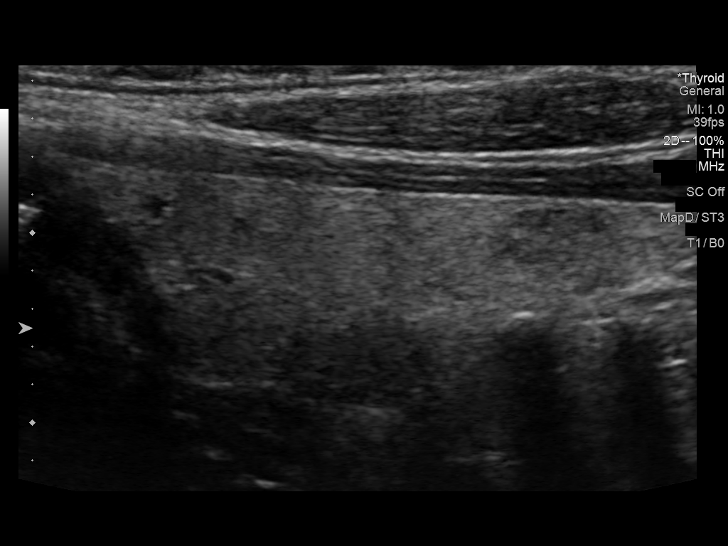
[im 29/43]
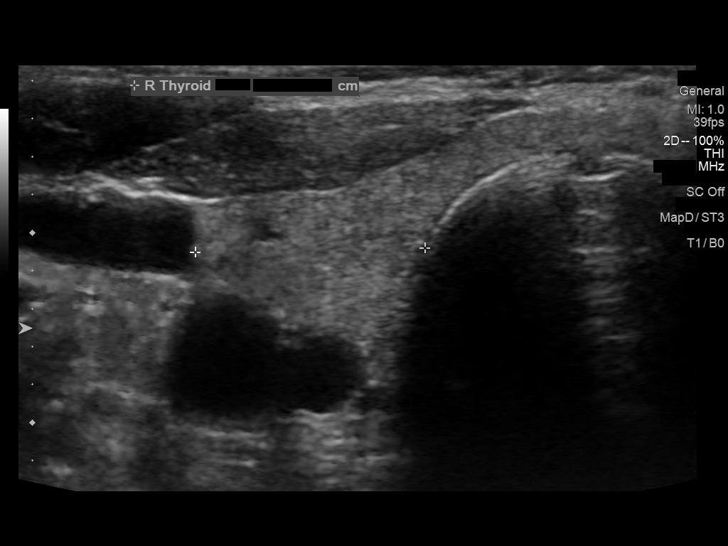
[im 32/43]
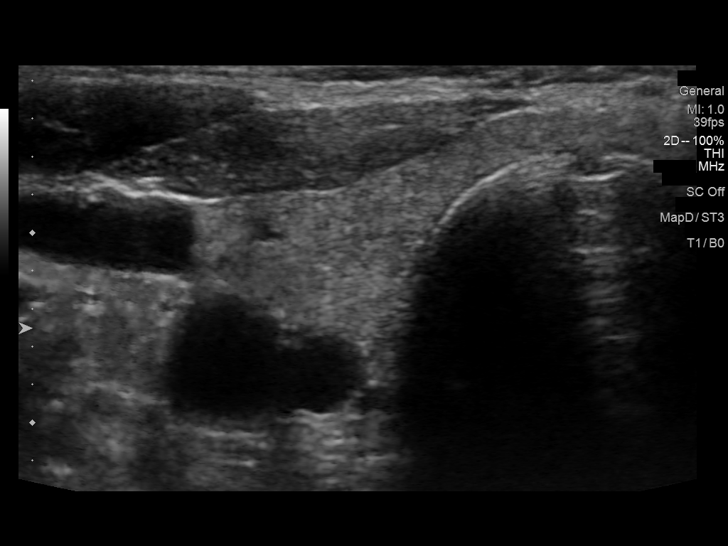
[im 36/43]
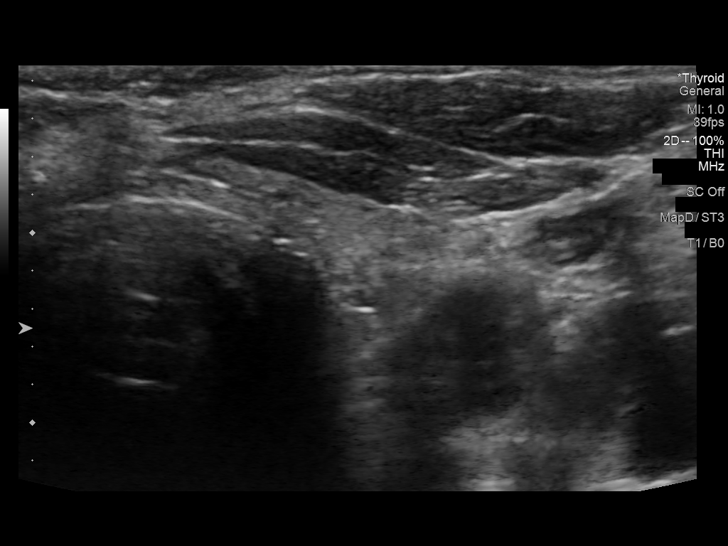
[im 39/43]
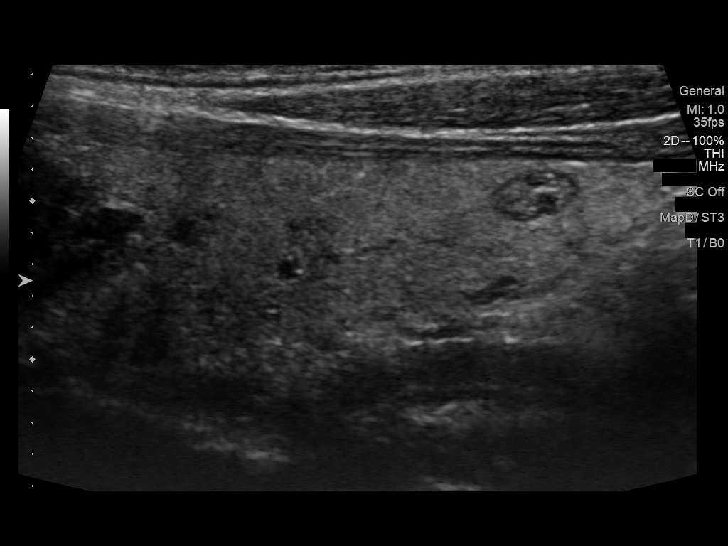
[im 43/43]
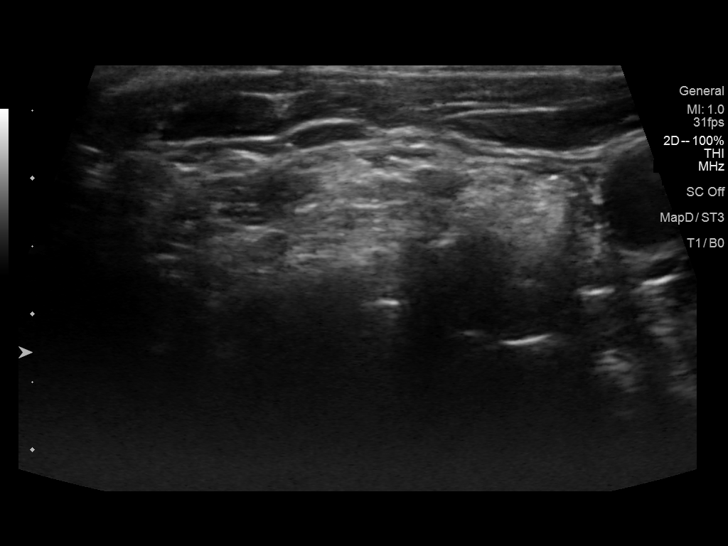

[13 of 25 positions shown; findings below may reference images not displayed]

FINDINGS: Parenchymal Echotexture: Moderately heterogenous

Isthmus: 0.2 cm, previously 0.2 cm

Right lobe: 4.1 x 1.4 x 1.2 cm, previously 3.9 x 1.2 x 1.2 cm

Left lobe: 4.0 x 1.2 x 0.9 cm, previously 3.7 x 1.0 x 0.9 cm

_________________________________________________________

Estimated total number of nodules >/= 1 cm: 0

Number of spongiform nodules >/=  2 cm not described below (TR1): 0

Number of mixed cystic and solid nodules >/= 1.5 cm not described
below (TR2): 0

_________________________________________________________

Thyroid tissue is heterogeneous. Multiple small nodules in the right
thyroid lobe, largest measuring up to 0.3 cm. Again noted is a
primarily isoechoic nodule in the inferior left thyroid lobe that
measures 0.5 x 0.4 x 0.4 cm and previously measured up to 0.4 cm.
IMPRESSION: Thyroid tissue is heterogeneous with several small nodules. These
nodules do not meet criteria for biopsy or dedicated follow-up.

The above is in keeping with the ACR TI-RADS recommendations - [HOSPITAL] [45];[DATE].

## 2020-03-12 DIAGNOSIS — G4733 Obstructive sleep apnea (adult) (pediatric): Secondary | ICD-10-CM | POA: Diagnosis not present

## 2020-03-27 DIAGNOSIS — K219 Gastro-esophageal reflux disease without esophagitis: Secondary | ICD-10-CM | POA: Diagnosis not present

## 2020-04-11 DIAGNOSIS — G4733 Obstructive sleep apnea (adult) (pediatric): Secondary | ICD-10-CM | POA: Diagnosis not present

## 2020-05-12 DIAGNOSIS — G4733 Obstructive sleep apnea (adult) (pediatric): Secondary | ICD-10-CM | POA: Diagnosis not present

## 2020-05-13 DIAGNOSIS — J479 Bronchiectasis, uncomplicated: Secondary | ICD-10-CM | POA: Diagnosis not present

## 2020-05-13 DIAGNOSIS — M069 Rheumatoid arthritis, unspecified: Secondary | ICD-10-CM | POA: Diagnosis not present

## 2020-05-13 DIAGNOSIS — E782 Mixed hyperlipidemia: Secondary | ICD-10-CM | POA: Diagnosis not present

## 2020-05-13 DIAGNOSIS — N951 Menopausal and female climacteric states: Secondary | ICD-10-CM | POA: Diagnosis not present

## 2020-05-13 DIAGNOSIS — D509 Iron deficiency anemia, unspecified: Secondary | ICD-10-CM | POA: Diagnosis not present

## 2020-05-13 DIAGNOSIS — K589 Irritable bowel syndrome without diarrhea: Secondary | ICD-10-CM | POA: Diagnosis not present

## 2020-05-13 DIAGNOSIS — E559 Vitamin D deficiency, unspecified: Secondary | ICD-10-CM | POA: Diagnosis not present

## 2020-05-13 DIAGNOSIS — R109 Unspecified abdominal pain: Secondary | ICD-10-CM | POA: Diagnosis not present

## 2020-05-13 DIAGNOSIS — E041 Nontoxic single thyroid nodule: Secondary | ICD-10-CM | POA: Diagnosis not present

## 2020-05-13 DIAGNOSIS — J45909 Unspecified asthma, uncomplicated: Secondary | ICD-10-CM | POA: Diagnosis not present

## 2020-05-13 DIAGNOSIS — M1990 Unspecified osteoarthritis, unspecified site: Secondary | ICD-10-CM | POA: Diagnosis not present

## 2020-05-13 DIAGNOSIS — R5381 Other malaise: Secondary | ICD-10-CM | POA: Diagnosis not present

## 2020-05-21 ENCOUNTER — Other Ambulatory Visit: Payer: Self-pay | Admitting: Cardiology

## 2020-06-06 ENCOUNTER — Telehealth: Payer: Self-pay | Admitting: Cardiology

## 2020-06-06 DIAGNOSIS — R221 Localized swelling, mass and lump, neck: Secondary | ICD-10-CM | POA: Diagnosis not present

## 2020-06-06 NOTE — Telephone Encounter (Signed)
Pt c/o of Chest Pain: STAT if CP now or developed within 24 hours  1. Are you having CP right now? no  2. Are you experiencing any other symptoms (ex. SOB, nausea, vomiting, sweating)? She said it was chest pain followed by extreme fatigue  3. How long have you been experiencing CP? Happened two times, in June. Week of June 21st and week of June 14th  4. Is your CP continuous or coming and going? Comes and goes  5. Have you taken Nitroglycerin? No, doesn't have it. He PCP told her to give [place] a call.  ?

## 2020-06-06 NOTE — Telephone Encounter (Signed)
Patient states that she had two episodes in June of sharp chest pain that lasted for less than a minute. She states that she has acid reflux and an esophageal hernia and she could tell that this pain was different. She states that pain did not last long and did not radiate. She denies any dizziness, SOB, nausea, or vomiting during episodes. She reports that she was actively working around her house during both of these episodes. She states that she does have extra diltiazem to take when needed for palpitations but did not take them during these times. States that she does have SOB going up stairs and is followed by pulmonary. She states that after these episodes that she became extremely fatigued. She states that she felt like her heart may have skipped a beat during these episodes. She does not have any BP or HR readings during these episodes but states that today her BP was 126/80. She has not had any more episodes in the past few weeks. She saw PCP today and advised her to follow up with cardiology. She has an appointment scheduled to see Dr. Radford Pax on 07/26. Advised patient to call back if chest pain returns, however if it does not resolve then she should go to the ER. Patient verbalized understanding.

## 2020-06-06 NOTE — Telephone Encounter (Signed)
I agree with your advice Michele Jones, await for Dr Theodosia Blender appointment on 7/26, go to ER if worsening chest pain

## 2020-06-11 DIAGNOSIS — R768 Other specified abnormal immunological findings in serum: Secondary | ICD-10-CM | POA: Diagnosis not present

## 2020-06-11 DIAGNOSIS — M150 Primary generalized (osteo)arthritis: Secondary | ICD-10-CM | POA: Diagnosis not present

## 2020-06-11 DIAGNOSIS — M2550 Pain in unspecified joint: Secondary | ICD-10-CM | POA: Diagnosis not present

## 2020-06-11 DIAGNOSIS — M3500 Sicca syndrome, unspecified: Secondary | ICD-10-CM | POA: Diagnosis not present

## 2020-06-11 DIAGNOSIS — Z681 Body mass index (BMI) 19 or less, adult: Secondary | ICD-10-CM | POA: Diagnosis not present

## 2020-06-11 DIAGNOSIS — M797 Fibromyalgia: Secondary | ICD-10-CM | POA: Diagnosis not present

## 2020-06-11 DIAGNOSIS — I7300 Raynaud's syndrome without gangrene: Secondary | ICD-10-CM | POA: Diagnosis not present

## 2020-06-11 DIAGNOSIS — G4733 Obstructive sleep apnea (adult) (pediatric): Secondary | ICD-10-CM | POA: Diagnosis not present

## 2020-06-17 ENCOUNTER — Telehealth: Payer: Self-pay | Admitting: Cardiology

## 2020-06-17 NOTE — Telephone Encounter (Signed)
Pt c/o of Chest Pain: STAT if CP now or developed within 24 hours  1. Are you having CP right now? No  2. Are you experiencing any other symptoms (ex. SOB, nausea, vomiting, sweating)? Fatigue, palpitations  3. How long have you been experiencing CP? About 1 month  4. Is your CP continuous or coming and going? Coming and going  5. Have you taken Nitroglycerin? No    Patient c/o Palpitations:  High priority if patient c/o lightheadedness, shortness of breath, or chest pain  1) How long have you had palpitations/irregular HR/ Afib? Are you having the symptoms now? Palpitations - No palpitations right now  2) Are you currently experiencing lightheadedness, SOB or CP? CP  3) Do you have a history of afib (atrial fibrillation) or irregular heart rhythm? Yes  4) Have you checked your BP or HR? (document readings if available): No  5) Are you experiencing any other symptoms? No  ?

## 2020-06-17 NOTE — Telephone Encounter (Signed)
Patient is calling to follow up on previous call regarding chest pain that occurred 06/09 and 06/16 that she described as sharp pain that lasted for a very short period and was followed by a few days of fatigue (see previous phone note).  Patient states that she has been feeling good since then until 07/14 when she started to feel fatigued again. She denies any chest pain. She states that she did have some pain under her left arm and hand. She also reports some palpitations that occur 1-2 times per day and some SOB in the mornings.  Taking cardizem 180 mg daily, she has 30 mg tablets to take as needed but has not been using them.  Patient is scheduled to see Dr. Radford Pax on 07/26.  Denies any current chest pain or SOB but states that she is still fatigued.

## 2020-06-17 NOTE — Telephone Encounter (Signed)
Please have her see PCP for fatigue this week

## 2020-06-17 NOTE — Telephone Encounter (Signed)
Spoke with the patient who states that she was seen a couple of weeks ago by her PCP. They are aware of her fatigue. They have recommended that she FU with Dr. Radford Pax. Patient will keep appointment with Dr. Radford Pax on 07/26 but will let us know if any new/worsening symptoms develop.

## 2020-06-23 NOTE — Progress Notes (Signed)
Date:  06/26/2020   ID:  Michele Jones, DOB 1951-03-28, MRN [id number]   PCP:  Christa See, FNP  Cardiologist:  Fransico Him, MD Electrophysiologist:  None   Chief Complaint:  OSA  History of Present Illness:    Michele Jones is a 69 y.o. 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 She has a history ofRBBB and nuclear stress test1/2018was normal. 2D echo 2017 showed normal LVF .    She is here today for followup and is doing well.   She denies any  PND, orthopnea, LE edema, palpitations, dizziness or syncope. SHe continues to have chronic DOE and has not seen Pulmonary in some time.  She also says that she still intermittently has sharp pain in her chest.  Coronary CTA in 2019 showed no CAD and Ca score of 0. She is compliant with her meds and is tolerating meds with no SE.    She is doing well with her CPAP device and thinks that she has gotten used to it.  She tolerates the mask and feels the pressure is adequate.  Since going on CPAP she feels rested most of the time in the am and has no significant daytime sleepiness.  She continues to have significant dry mouth but get claustrophobic with the chin strap.  She does not think that he snores.     Prior CV studies:   The following studies were reviewed today:  PAP compliance download  Past Medical History:  Diagnosis Date   Allergy    Bronchiectasis (HCC)    COPD (chronic obstructive pulmonary disease) (HCC)    alpha 1 antitrypsin deficiency   Fibromyalgia    Malaria    OSA (obstructive sleep apnea)    mild with AHI 5.8/hr now on CPAP at 10cm H2o   Osteoporosis    PAC (premature atrial contraction)    Paroxysmal atrial tachycardia (HCC)    PVC's (premature ventricular contractions)    RBBB    no  ischemia on nuclear stree test   Past Surgical History:  Procedure Laterality Date   APPENDECTOMY     TEMPOROMANDIBULAR JOINT SURGERY       Current Meds  Medication Sig   ANORO ELLIPTA 62.5-25 MCG/INH AEPB Inhale 1 Dose into the lungs daily.   azithromycin (ZITHROMAX) 500 MG tablet Take 1 tablet by mouth 3 (three) times a week.   b complex vitamins tablet Take 1 tablet by mouth daily.   Boron 3 MG CAPS Take by mouth daily.   Calcium-Vitamin A-Vitamin D (LIQUID CALCIUM PO) Take 5 mLs by mouth daily.   diltiazem (CARDIZEM CD) 180 MG 24 hr capsule Take 1 capsule (180 mg total) by mouth daily.   diltiazem (CARDIZEM) 30 MG tablet TAKE 1 TABLET (30 MG TOTAL) BY MOUTH DAILY AS NEEDED. FOR PALPITATIONS   glucosamine-chondroitin 500-400 MG tablet Take 1 tablet by mouth daily.    Grape Seed 100 MG CAPS Take by mouth 2 (two) times daily.   Multiple Vitamins-Minerals (MULTIVITAMIN WITH MINERALS) tablet Take 1 tablet by mouth daily.   Olive Leaf Extract 150 MG CAPS Take by mouth daily.   zolpidem (AMBIEN) 10 MG tablet Take 10 mg by mouth at bedtime as needed for sleep.   [DISCONTINUED] diltiazem (CARDIZEM CD) 180 MG 24 hr capsule TAKE 1 CAPSULE BY MOUTH  EVERY DAY     Allergies:   Amoxicillin, Molds & smuts, Septra [sulfamethoxazole-trimethoprim], Augmentin [amoxicillin-pot clavulanate], Latex, Levaquin [levofloxacin in d5w], Levofloxacin, Penicillins, Sulfamethoxazole-trimethoprim, and Tetracycline   Social History   Tobacco Use   Smoking status: Never Smoker   Smokeless tobacco: Never Used  Vaping Use   Vaping Use: Never used  Substance Use Topics   Alcohol use: No    Alcohol/week: 0.0 standard drinks   Drug use: No     Family Hx: The patient's family history includes Cancer in her father and maternal grandmother; Diabetes in her paternal grandmother; Hypertension in her brother; Stroke in her mother.  ROS:   Please see the history of present illness.     All  other systems reviewed and are negative.   Labs/Other Tests and Data Reviewed:    Recent Labs: No results found for requested labs within last 8760 hours.   Recent Lipid Panel No results found for: CHOL, TRIG, HDL, CHOLHDL, LDLCALC, LDLDIRECT  Wt Readings from Last 3 Encounters:  06/24/20 111 lb 9.6 oz (50.6 kg)  06/26/19 110 lb (49.9 kg)  05/20/18 114 lb (51.7 kg)     Objective:    Vital Signs:  BP (!) 132/66    Pulse 72    Ht 5\' 7"  (1.702 m)    Wt 111 lb 9.6 oz (50.6 kg)    BMI 17.48 kg/m    CONSTITUTIONAL:  Well nourished, well developed female in no acute distress.  EYES: anicteric MOUTH: oral mucosa is pink RESPIRATORY: Normal respiratory effort, symmetric expansion CARDIOVASCULAR: No peripheral edema SKIN: No rash, lesions or ulcers MUSCULOSKELETAL: no digital cyanosis NEURO: Cranial Nerves II-XII grossly intact, moves all extremities PSYCH: Intact judgement and insight.  A&O x 3, Mood/affect appropriate  EKG performed today and showed NSR with IRBBB and no ST changes  ASSESSMENT & PLAN:    1.  OSA - The patient is tolerating PAP therapy well without any problems. The PAP download was reviewed today and showed an AHI of 0.2/hr on 10 cm H2O with 100% compliance in using more than 4 hours nightly.  The patient has been using and benefiting from PAP use and will continue to benefit from therapy.   2.  PAT -continue on Cardizem CD 180mg  daily -I encouraged her to use the short acting Cardizem 30mg  PRN with palpitations.  She already has a a Rx for this  3.  PVCs -suppressed with Cardizem  4.  Chronic atypical CP and DOE -likely related to her underlying bronchiectasis -I have recommended she make an appt with her Pulmonologist -coronary CTA showed no CAD in 2019>>I do not think that this is cardiac in origin   Medication Adjustments/Labs and Tests Ordered: Current medicines are reviewed at length with the patient today.  Concerns regarding medicines are outlined  above.  Tests Ordered: Orders Placed This Encounter  Procedures   EKG 12-Lead   Medication Changes: Meds ordered this encounter  Medications   diltiazem (CARDIZEM CD) 180 MG 24 hr capsule    Sig: Take 1 capsule (180 mg total) by mouth daily.    Dispense:  90 capsule    Refill:  3    Patient prefers [place] brand    Disposition:  Follow up in 1 year(s)  Signed, Fransico Him, MD  06/26/2020 7:11 PM    [facility]

## 2020-06-24 ENCOUNTER — Other Ambulatory Visit: Payer: Self-pay

## 2020-06-24 ENCOUNTER — Ambulatory Visit (INDEPENDENT_AMBULATORY_CARE_PROVIDER_SITE_OTHER): Payer: Medicare HMO | Admitting: Cardiology

## 2020-06-24 ENCOUNTER — Encounter: Payer: Self-pay | Admitting: Cardiology

## 2020-06-24 VITALS — BP 132/66 | HR 72 | Ht 67.0 in | Wt 111.6 lb

## 2020-06-24 DIAGNOSIS — R0600 Dyspnea, unspecified: Secondary | ICD-10-CM | POA: Diagnosis not present

## 2020-06-24 DIAGNOSIS — I471 Supraventricular tachycardia: Secondary | ICD-10-CM | POA: Diagnosis not present

## 2020-06-24 DIAGNOSIS — G4733 Obstructive sleep apnea (adult) (pediatric): Secondary | ICD-10-CM

## 2020-06-24 DIAGNOSIS — R0609 Other forms of dyspnea: Secondary | ICD-10-CM

## 2020-06-24 DIAGNOSIS — R0789 Other chest pain: Secondary | ICD-10-CM

## 2020-06-24 DIAGNOSIS — I4719 Other supraventricular tachycardia: Secondary | ICD-10-CM

## 2020-06-24 DIAGNOSIS — I493 Ventricular premature depolarization: Secondary | ICD-10-CM | POA: Diagnosis not present

## 2020-06-24 MED ORDER — DILTIAZEM HCL ER COATED BEADS 180 MG PO CP24: 180.0000 mg | ORAL_CAPSULE | Freq: Every day | ORAL | 3 refills | Status: DC

## 2020-06-24 NOTE — Patient Instructions (Signed)

## 2020-07-11 DIAGNOSIS — Z20828 Contact with and (suspected) exposure to other viral communicable diseases: Secondary | ICD-10-CM | POA: Diagnosis not present

## 2020-07-12 DIAGNOSIS — G4733 Obstructive sleep apnea (adult) (pediatric): Secondary | ICD-10-CM | POA: Diagnosis not present

## 2020-07-15 DIAGNOSIS — U071 COVID-19: Secondary | ICD-10-CM | POA: Diagnosis not present

## 2020-07-23 ENCOUNTER — Other Ambulatory Visit: Payer: Self-pay | Admitting: Critical Care Medicine

## 2020-07-23 ENCOUNTER — Other Ambulatory Visit: Payer: Medicare HMO

## 2020-07-23 DIAGNOSIS — Z20822 Contact with and (suspected) exposure to covid-19: Secondary | ICD-10-CM | POA: Diagnosis not present

## 2020-07-24 LAB — SARS-COV-2, NAA 2 DAY TAT

## 2020-07-24 LAB — NOVEL CORONAVIRUS, NAA: SARS-CoV-2, NAA: NOT DETECTED

## 2020-07-25 ENCOUNTER — Other Ambulatory Visit: Payer: Self-pay | Admitting: Cardiology

## 2020-07-30 ENCOUNTER — Ambulatory Visit: Payer: Medicare HMO | Admitting: Cardiology

## 2020-08-12 DIAGNOSIS — G4733 Obstructive sleep apnea (adult) (pediatric): Secondary | ICD-10-CM | POA: Diagnosis not present

## 2020-08-14 DIAGNOSIS — G4733 Obstructive sleep apnea (adult) (pediatric): Secondary | ICD-10-CM | POA: Diagnosis not present

## 2020-08-14 DIAGNOSIS — I7300 Raynaud's syndrome without gangrene: Secondary | ICD-10-CM | POA: Diagnosis not present

## 2020-08-14 DIAGNOSIS — K119 Disease of salivary gland, unspecified: Secondary | ICD-10-CM | POA: Diagnosis not present

## 2020-08-14 DIAGNOSIS — I4510 Unspecified right bundle-branch block: Secondary | ICD-10-CM | POA: Diagnosis not present

## 2020-08-14 DIAGNOSIS — I471 Supraventricular tachycardia: Secondary | ICD-10-CM | POA: Diagnosis not present

## 2020-08-14 DIAGNOSIS — J449 Chronic obstructive pulmonary disease, unspecified: Secondary | ICD-10-CM | POA: Diagnosis not present

## 2020-08-14 DIAGNOSIS — E049 Nontoxic goiter, unspecified: Secondary | ICD-10-CM | POA: Diagnosis not present

## 2020-08-14 DIAGNOSIS — J479 Bronchiectasis, uncomplicated: Secondary | ICD-10-CM | POA: Diagnosis not present

## 2020-08-14 DIAGNOSIS — E785 Hyperlipidemia, unspecified: Secondary | ICD-10-CM | POA: Diagnosis not present

## 2020-08-14 DIAGNOSIS — E8801 Alpha-1-antitrypsin deficiency: Secondary | ICD-10-CM | POA: Diagnosis not present

## 2020-08-20 ENCOUNTER — Other Ambulatory Visit: Payer: Self-pay | Admitting: Family Medicine

## 2020-08-20 DIAGNOSIS — Z1231 Encounter for screening mammogram for malignant neoplasm of breast: Secondary | ICD-10-CM | POA: Diagnosis not present

## 2020-08-20 DIAGNOSIS — Z681 Body mass index (BMI) 19 or less, adult: Secondary | ICD-10-CM | POA: Diagnosis not present

## 2020-08-20 DIAGNOSIS — Z124 Encounter for screening for malignant neoplasm of cervix: Secondary | ICD-10-CM | POA: Diagnosis not present

## 2020-08-20 DIAGNOSIS — Z01419 Encounter for gynecological examination (general) (routine) without abnormal findings: Secondary | ICD-10-CM | POA: Diagnosis not present

## 2020-08-20 DIAGNOSIS — R591 Generalized enlarged lymph nodes: Secondary | ICD-10-CM

## 2020-08-21 DIAGNOSIS — D649 Anemia, unspecified: Secondary | ICD-10-CM | POA: Diagnosis not present

## 2020-08-21 DIAGNOSIS — D509 Iron deficiency anemia, unspecified: Secondary | ICD-10-CM | POA: Diagnosis not present

## 2020-08-21 DIAGNOSIS — N959 Unspecified menopausal and perimenopausal disorder: Secondary | ICD-10-CM | POA: Diagnosis not present

## 2020-08-21 DIAGNOSIS — M26629 Arthralgia of temporomandibular joint, unspecified side: Secondary | ICD-10-CM | POA: Diagnosis not present

## 2020-08-21 DIAGNOSIS — M1990 Unspecified osteoarthritis, unspecified site: Secondary | ICD-10-CM | POA: Diagnosis not present

## 2020-08-21 DIAGNOSIS — E049 Nontoxic goiter, unspecified: Secondary | ICD-10-CM | POA: Diagnosis not present

## 2020-08-21 DIAGNOSIS — R5381 Other malaise: Secondary | ICD-10-CM | POA: Diagnosis not present

## 2020-09-05 ENCOUNTER — Other Ambulatory Visit: Payer: Self-pay

## 2020-09-05 ENCOUNTER — Ambulatory Visit
Admission: RE | Admit: 2020-09-05 | Discharge: 2020-09-05 | Disposition: A | Discharge status: Home / Self Care | Payer: Medicare HMO | Source: Ambulatory Visit | Attending: Family Medicine | Admitting: Family Medicine

## 2020-09-05 DIAGNOSIS — K115 Sialolithiasis: Secondary | ICD-10-CM | POA: Diagnosis not present

## 2020-09-05 DIAGNOSIS — J322 Chronic ethmoidal sinusitis: Secondary | ICD-10-CM | POA: Diagnosis not present

## 2020-09-05 DIAGNOSIS — J0120 Acute ethmoidal sinusitis, unspecified: Secondary | ICD-10-CM | POA: Diagnosis not present

## 2020-09-05 DIAGNOSIS — R591 Generalized enlarged lymph nodes: Secondary | ICD-10-CM

## 2020-09-05 DIAGNOSIS — J3489 Other specified disorders of nose and nasal sinuses: Secondary | ICD-10-CM | POA: Diagnosis not present

## 2020-09-05 MED ORDER — IOPAMIDOL (ISOVUE-300) INJECTION 61%
75.0000 mL | Freq: Once | INTRAVENOUS | Status: AC | PRN
  Administered 2020-09-05: 75 mL via INTRAVENOUS

## 2020-09-09 DIAGNOSIS — G4733 Obstructive sleep apnea (adult) (pediatric): Secondary | ICD-10-CM | POA: Diagnosis not present

## 2020-09-13 ENCOUNTER — Other Ambulatory Visit: Payer: Medicare HMO

## 2020-09-13 DIAGNOSIS — Z20822 Contact with and (suspected) exposure to covid-19: Secondary | ICD-10-CM

## 2020-09-14 LAB — NOVEL CORONAVIRUS, NAA: SARS-CoV-2, NAA: NOT DETECTED

## 2020-09-14 LAB — SARS-COV-2, NAA 2 DAY TAT

## 2020-10-10 DIAGNOSIS — G4733 Obstructive sleep apnea (adult) (pediatric): Secondary | ICD-10-CM | POA: Diagnosis not present

## 2020-10-15 DIAGNOSIS — M549 Dorsalgia, unspecified: Secondary | ICD-10-CM | POA: Diagnosis not present

## 2020-10-15 DIAGNOSIS — S99922A Unspecified injury of left foot, initial encounter: Secondary | ICD-10-CM | POA: Diagnosis not present

## 2020-10-15 DIAGNOSIS — M5416 Radiculopathy, lumbar region: Secondary | ICD-10-CM | POA: Diagnosis not present

## 2020-10-15 DIAGNOSIS — M79645 Pain in left finger(s): Secondary | ICD-10-CM | POA: Diagnosis not present

## 2020-10-15 DIAGNOSIS — M542 Cervicalgia: Secondary | ICD-10-CM | POA: Diagnosis not present

## 2020-10-21 ENCOUNTER — Other Ambulatory Visit: Payer: Medicare HMO

## 2020-10-21 DIAGNOSIS — Z20822 Contact with and (suspected) exposure to covid-19: Secondary | ICD-10-CM | POA: Diagnosis not present

## 2020-10-22 LAB — SARS-COV-2, NAA 2 DAY TAT

## 2020-10-22 LAB — NOVEL CORONAVIRUS, NAA: SARS-CoV-2, NAA: NOT DETECTED

## 2020-11-07 DIAGNOSIS — S99922D Unspecified injury of left foot, subsequent encounter: Secondary | ICD-10-CM | POA: Diagnosis not present

## 2020-11-07 DIAGNOSIS — M79645 Pain in left finger(s): Secondary | ICD-10-CM | POA: Diagnosis not present

## 2020-11-07 DIAGNOSIS — M5416 Radiculopathy, lumbar region: Secondary | ICD-10-CM | POA: Diagnosis not present

## 2020-11-07 DIAGNOSIS — I700 Atherosclerosis of aorta: Secondary | ICD-10-CM | POA: Diagnosis not present

## 2020-11-07 DIAGNOSIS — M542 Cervicalgia: Secondary | ICD-10-CM | POA: Diagnosis not present

## 2020-11-07 DIAGNOSIS — M549 Dorsalgia, unspecified: Secondary | ICD-10-CM | POA: Diagnosis not present

## 2020-11-09 DIAGNOSIS — G4733 Obstructive sleep apnea (adult) (pediatric): Secondary | ICD-10-CM | POA: Diagnosis not present

## 2020-11-12 DIAGNOSIS — M5416 Radiculopathy, lumbar region: Secondary | ICD-10-CM | POA: Diagnosis not present

## 2020-11-15 ENCOUNTER — Other Ambulatory Visit: Payer: Self-pay | Admitting: Neurosurgery

## 2020-11-15 DIAGNOSIS — M5416 Radiculopathy, lumbar region: Secondary | ICD-10-CM

## 2020-11-18 DIAGNOSIS — H5210 Myopia, unspecified eye: Secondary | ICD-10-CM | POA: Diagnosis not present

## 2020-11-18 DIAGNOSIS — H259 Unspecified age-related cataract: Secondary | ICD-10-CM | POA: Diagnosis not present

## 2020-11-18 DIAGNOSIS — Z135 Encounter for screening for eye and ear disorders: Secondary | ICD-10-CM | POA: Diagnosis not present

## 2020-11-18 DIAGNOSIS — H04123 Dry eye syndrome of bilateral lacrimal glands: Secondary | ICD-10-CM | POA: Diagnosis not present

## 2020-11-19 ENCOUNTER — Other Ambulatory Visit: Payer: Medicare HMO

## 2020-11-19 DIAGNOSIS — H524 Presbyopia: Secondary | ICD-10-CM | POA: Diagnosis not present

## 2020-11-19 DIAGNOSIS — Z20822 Contact with and (suspected) exposure to covid-19: Secondary | ICD-10-CM

## 2020-11-19 DIAGNOSIS — H52223 Regular astigmatism, bilateral: Secondary | ICD-10-CM | POA: Diagnosis not present

## 2020-11-20 LAB — NOVEL CORONAVIRUS, NAA: SARS-CoV-2, NAA: NOT DETECTED

## 2020-11-20 LAB — SARS-COV-2, NAA 2 DAY TAT

## 2020-11-26 ENCOUNTER — Telehealth: Payer: Self-pay | Admitting: *Deleted

## 2020-11-26 DIAGNOSIS — G4733 Obstructive sleep apnea (adult) (pediatric): Secondary | ICD-10-CM

## 2020-11-26 NOTE — Telephone Encounter (Signed)
Fax sent to [facility].

## 2020-11-26 NOTE — Telephone Encounter (Signed)
Patient called requesting a Rx for a new cpap machine. Staff message sent to dr Mayford Knife to write the Rx.  RE: New Rx  Quintella Reichert, MD  Reesa Chew, CMA Please order a ResMed CPAP at 10cm H2O with heated humidity and mask of choice         ----- Message -----  From: Reesa Chew, CMA  Sent: 11/26/2020  1:23 PM EST  To: Quintella Reichert, MD  Subject: New Rx                      Patients machine has exceeded the motor life, please write Rx for a new machine

## 2020-12-03 NOTE — Telephone Encounter (Signed)
    Pt is calling back, she said, she spoke with [facility] today and was told they haven't received the new prescription for her cpap machine. She said if we can refax it. She gave fax# [phone] put pap intake on the cover sheet, also per [facility] to include her cpap records. She also wanted to get a callback for confirmation

## 2020-12-07 ENCOUNTER — Ambulatory Visit
Admission: RE | Admit: 2020-12-07 | Discharge: 2020-12-07 | Disposition: A | Discharge status: Home / Self Care | Payer: Medicare HMO | Source: Ambulatory Visit | Attending: Neurosurgery | Admitting: Neurosurgery

## 2020-12-07 ENCOUNTER — Other Ambulatory Visit: Payer: Self-pay

## 2020-12-07 DIAGNOSIS — M5416 Radiculopathy, lumbar region: Secondary | ICD-10-CM

## 2020-12-07 DIAGNOSIS — M5126 Other intervertebral disc displacement, lumbar region: Secondary | ICD-10-CM | POA: Diagnosis not present

## 2020-12-07 DIAGNOSIS — M5136 Other intervertebral disc degeneration, lumbar region: Secondary | ICD-10-CM | POA: Diagnosis not present

## 2020-12-07 DIAGNOSIS — R200 Anesthesia of skin: Secondary | ICD-10-CM | POA: Diagnosis not present

## 2020-12-07 DIAGNOSIS — M5127 Other intervertebral disc displacement, lumbosacral region: Secondary | ICD-10-CM | POA: Diagnosis not present

## 2020-12-09 DIAGNOSIS — M5416 Radiculopathy, lumbar region: Secondary | ICD-10-CM | POA: Diagnosis not present

## 2020-12-09 DIAGNOSIS — G4733 Obstructive sleep apnea (adult) (pediatric): Secondary | ICD-10-CM | POA: Diagnosis not present

## 2020-12-11 DIAGNOSIS — N959 Unspecified menopausal and perimenopausal disorder: Secondary | ICD-10-CM | POA: Diagnosis not present

## 2020-12-11 DIAGNOSIS — I4891 Unspecified atrial fibrillation: Secondary | ICD-10-CM | POA: Diagnosis not present

## 2020-12-11 DIAGNOSIS — G4730 Sleep apnea, unspecified: Secondary | ICD-10-CM | POA: Diagnosis not present

## 2020-12-11 DIAGNOSIS — R5381 Other malaise: Secondary | ICD-10-CM | POA: Diagnosis not present

## 2020-12-11 NOTE — Telephone Encounter (Signed)
Order resent to pap intake at [facility] via community message.

## 2020-12-12 DIAGNOSIS — M2550 Pain in unspecified joint: Secondary | ICD-10-CM | POA: Diagnosis not present

## 2020-12-12 DIAGNOSIS — M150 Primary generalized (osteo)arthritis: Secondary | ICD-10-CM | POA: Diagnosis not present

## 2020-12-12 DIAGNOSIS — Z681 Body mass index (BMI) 19 or less, adult: Secondary | ICD-10-CM | POA: Diagnosis not present

## 2020-12-12 DIAGNOSIS — M797 Fibromyalgia: Secondary | ICD-10-CM | POA: Diagnosis not present

## 2020-12-12 DIAGNOSIS — I7300 Raynaud's syndrome without gangrene: Secondary | ICD-10-CM | POA: Diagnosis not present

## 2020-12-12 DIAGNOSIS — M3500 Sicca syndrome, unspecified: Secondary | ICD-10-CM | POA: Diagnosis not present

## 2020-12-18 ENCOUNTER — Other Ambulatory Visit: Payer: Self-pay

## 2020-12-18 ENCOUNTER — Ambulatory Visit (INDEPENDENT_AMBULATORY_CARE_PROVIDER_SITE_OTHER): Payer: Medicare HMO | Admitting: Cardiology

## 2020-12-18 ENCOUNTER — Encounter: Payer: Self-pay | Admitting: Cardiology

## 2020-12-18 VITALS — BP 124/80 | HR 70 | Ht 67.0 in | Wt 110.6 lb

## 2020-12-18 DIAGNOSIS — R600 Localized edema: Secondary | ICD-10-CM

## 2020-12-18 DIAGNOSIS — I471 Supraventricular tachycardia: Secondary | ICD-10-CM

## 2020-12-18 DIAGNOSIS — I493 Ventricular premature depolarization: Secondary | ICD-10-CM | POA: Diagnosis not present

## 2020-12-18 DIAGNOSIS — G4733 Obstructive sleep apnea (adult) (pediatric): Secondary | ICD-10-CM | POA: Diagnosis not present

## 2020-12-18 DIAGNOSIS — R0789 Other chest pain: Secondary | ICD-10-CM | POA: Diagnosis not present

## 2020-12-18 DIAGNOSIS — I700 Atherosclerosis of aorta: Secondary | ICD-10-CM

## 2020-12-18 MED ORDER — FUROSEMIDE 20 MG PO TABS: 20.0000 mg | ORAL_TABLET | Freq: Every day | ORAL | 3 refills | Status: DC | PRN

## 2020-12-18 NOTE — Patient Instructions (Signed)
Medication Instructions:  Your physician has recommended you make the following change in your medication:  1) START taking Lasix (furosemide) 20 mg daily as needed for swelling.  *If you need a refill on your cardiac medications before your next appointment, please call your pharmacy*   Testing/Procedures: Your physician has requested that you have an echocardiogram. Echocardiography is a painless test that uses sound waves to create images of your heart. It provides your doctor with information about the size and shape of your heart and how well your heart's chambers and valves are working. This procedure takes approximately one hour. There are no restrictions for this procedure.  Follow-Up: At [facility], you and your health needs are our priority.  As part of our continuing mission to provide you with exceptional heart care, we have created designated Provider Care Teams.  These Care Teams include your primary Cardiologist (physician) and Advanced Practice Providers (APPs -  Physician Assistants and Nurse Practitioners) who all work together to provide you with the care you need, when you need it.  Your next appointment:   1 year(s)  The format for your next appointment:   In Person  Provider:   You may see Fransico Him, MD or one of the following Advanced Practice Providers on your designated Care Team:    Melina Copa, PA-C  Ermalinda Barrios, PA-C

## 2020-12-18 NOTE — Progress Notes (Signed)
Date:  12/18/2020   ID:  Michele Jones, DOB 1951/06/06, MRN [id number]   PCP:  Vernie Shanks, MD  Cardiologist:  Fransico Him, MD Electrophysiologist:  None   Chief Complaint:  OSA  History of Present Illness:    Michele Jones is a 70 y.o. 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 She has a history ofRBBB and nuclear stress test1/2018was normal. 2D echo 2017 showed normal LVF .    She is here today for followup and is doing well.  She denies any chest pain or pressure, SOB, DOE, PND, orthopnea, dizziness, palpitations or syncope. She tells me that over the weekend she noticed some new LE edema in her legs.  She does not think that she ate anything salty this weekend.  Yesterday she starting feeling tired.  She had been feeling good prior to that and had no problems with walking or hiking.  She also has noticed some palpitations in her chest as well.She is compliant with her meds and is tolerating meds with no SE.    She is doing well with her CPAP device and thinks that she has gotten used to it.  She tolerates the mask and feels the pressure is adequate.  Since going on CPAP she feels rested in the am and has no significant daytime sleepiness.  She denies any significant mouth or nasal dryness or nasal congestion.  She does not think that he snores.     Prior CV studies:   The following studies were reviewed today:  PAP compliance download  Past Medical History:  Diagnosis Date  . Allergy   . Bronchiectasis ([place])   . COPD (chronic obstructive pulmonary disease) (HCC)    alpha 1 antitrypsin deficiency  . Fibromyalgia   . Malaria   . OSA (obstructive sleep apnea)    mild with AHI 5.8/hr now on CPAP at 10cm H2o  . Osteoporosis   . PAC (premature atrial  contraction)   . Paroxysmal atrial tachycardia ([place])   . PVC's (premature ventricular contractions)   . RBBB    no ischemia on nuclear stree test   Past Surgical History:  Procedure Laterality Date  . APPENDECTOMY    . TEMPOROMANDIBULAR JOINT SURGERY       Current Meds  Medication Sig  . ANORO ELLIPTA 62.5-25 MCG/INH AEPB Inhale 1 Dose into the lungs daily.  [name] azithromycin (ZITHROMAX) 500 MG tablet Take 1 tablet by mouth 3 (three) times a week.  [name] b complex vitamins tablet Take 1 tablet by mouth daily.  . Boron 3 MG CAPS Take by mouth daily.  . Calcium-Vitamin A-Vitamin D (LIQUID CALCIUM PO) Take 5 mLs by mouth daily.  . ciprofloxacin (CIPRO) 500 MG tablet Take 500 mg by mouth 2 (two) times daily.  [name] diltiazem (CARDIZEM CD) 180 MG 24 hr capsule TAKE 1 CAPSULE BY MOUTH EVERY DAY  . diltiazem (CARDIZEM) 30 MG tablet TAKE 1 TABLET (30 MG TOTAL) BY MOUTH DAILY AS NEEDED. FOR PALPITATIONS  . glucosamine-chondroitin 500-400 MG tablet Take 1 tablet by mouth daily.   . Grape Seed 100 MG CAPS Take by mouth 2 (two) times daily.  . Multiple Vitamins-Minerals (MULTIVITAMIN WITH MINERALS) tablet Take 1 tablet by mouth daily.  [name] Leaf Extract 150 MG CAPS Take by mouth daily.  . rosuvastatin (  CRESTOR) 5 MG tablet Take 5 mg by mouth once a week.  . zolpidem (AMBIEN) 10 MG tablet Take 10 mg by mouth at bedtime as needed for sleep.     Allergies:   Amoxicillin, Molds & smuts, Septra [sulfamethoxazole-trimethoprim], Augmentin [amoxicillin-pot clavulanate], Latex, Levaquin [levofloxacin in d5w], Levofloxacin, Penicillins, Sulfamethoxazole-trimethoprim, and Tetracycline   Social History   Tobacco Use  . Smoking status: Never Smoker  . Smokeless tobacco: Never Used  Vaping Use  . Vaping Use: Never used  Substance Use Topics  . Alcohol use: No    Alcohol/week: 0.0 standard drinks  . Drug use: No     Family Hx: The patient's family history includes Cancer in her father and maternal  grandmother; Diabetes in her paternal grandmother; Hypertension in her brother; Stroke in her mother.  ROS:   Please see the history of present illness.     All other systems reviewed and are negative.   Labs/Other Tests and Data Reviewed:    Recent Labs: No results found for requested labs within last 8760 hours.   Recent Lipid Panel No results found for: CHOL, TRIG, HDL, CHOLHDL, LDLCALC, LDLDIRECT  Wt Readings from Last 3 Encounters:  12/18/20 110 lb 9.6 oz (50.2 kg)  06/24/20 111 lb 9.6 oz (50.6 kg)  06/26/19 110 lb (49.9 kg)     Objective:    Vital Signs:  BP 124/80   Pulse 70   Ht 5\' 7"  (1.702 m)   Wt 110 lb 9.6 oz (50.2 kg)   SpO2 99%   BMI 17.32 kg/m    GEN: Well nourished, well developed in no acute distress HEENT: Normal NECK: No JVD; No carotid bruits LYMPHATICS: No lymphadenopathy CARDIAC:RRR, no murmurs, rubs, gallops RESPIRATORY:  Clear to auscultation without rales, wheezing or rhonchi  ABDOMEN: Soft, non-tender, non-distended MUSCULOSKELETAL:  trace edema in right ankle in area of her fat pad; No deformity  SKIN: Warm and dry NEUROLOGIC:  Alert and oriented x 3 PSYCHIATRIC:  Normal affect   EKG was not performed today  ASSESSMENT & PLAN:    1.  OSA -  The patient is tolerating PAP therapy well without any problems. The PAP download was reviewed today and showed an AHI of 0.2/hr on 10 cm H2O with 100% compliance in using more than 4 hours nightly.  The patient has been using and benefiting from PAP use and will continue to benefit from therapy.   2.  PAT -She has not had any palpitations since I saw her last -continue on Cardizem CD 180mg  daily and  short acting Cardizem 30mg  PRN with palpitations.    3.  PVCs -suppressed with Cardizem  4.  Chronic atypical CP and DOE -likely related to her underlying bronchiectasis -coronary CTA showed no CAD in 2019>>I do not think that this is cardiac in origin  5.  LE edema -this just started over the  weekend>>she does not think that her diet has changed any but has noticed more palpitations in the past few days -repeat 2D echo to make sure LVF is normal -Lasix 20mg  daily PRN for LE edema -encouraged to follow a 2gm Na diet  6.  Aortic atherosclerosis -noted in abdominal aorta by CT 09/2020 -started on statin per PCP   Medication Adjustments/Labs and Tests Ordered: Current medicines are reviewed at length with the patient today.  Concerns regarding medicines are outlined above.  Tests Ordered: No orders of the defined types were placed in this encounter.  Medication Changes: No orders of  the defined types were placed in this encounter.   Disposition:  Follow up in 1 year(s)  Signed, Fransico Him, MD  12/18/2020 12:17 PM    [place]

## 2020-12-25 DIAGNOSIS — Z0100 Encounter for examination of eyes and vision without abnormal findings: Secondary | ICD-10-CM | POA: Diagnosis not present

## 2021-01-09 ENCOUNTER — Ambulatory Visit (HOSPITAL_COMMUNITY): Payer: Medicare HMO | Attending: Cardiology

## 2021-01-09 ENCOUNTER — Other Ambulatory Visit: Payer: Self-pay

## 2021-01-09 DIAGNOSIS — G4733 Obstructive sleep apnea (adult) (pediatric): Secondary | ICD-10-CM | POA: Diagnosis not present

## 2021-01-09 DIAGNOSIS — R600 Localized edema: Secondary | ICD-10-CM | POA: Diagnosis not present

## 2021-01-09 LAB — ECHOCARDIOGRAM COMPLETE
Area-P 1/2: 5.31 cm2
S' Lateral: 1.7 cm

## 2021-01-13 ENCOUNTER — Telehealth: Payer: Self-pay

## 2021-01-13 DIAGNOSIS — R600 Localized edema: Secondary | ICD-10-CM

## 2021-01-13 NOTE — Telephone Encounter (Signed)
Per Dr. Radford Pax, patient needs BNP. Patient will come in on 01/15/21 for BNP.   The patient has been notified of the result and verbalized understanding.  All questions (if any) were answered. Michaelyn Barter, RN 01/13/2021 11:19 AM

## 2021-01-13 NOTE — Telephone Encounter (Signed)
-----   Message from Sueanne Margarita, MD sent at 01/11/2021  7:34 PM EST ----- Normal heart function with increased stiffness of heart muscle related to aging - essentially normal echo

## 2021-01-15 ENCOUNTER — Other Ambulatory Visit: Payer: Medicare HMO

## 2021-01-15 ENCOUNTER — Other Ambulatory Visit: Payer: Self-pay

## 2021-01-15 DIAGNOSIS — R600 Localized edema: Secondary | ICD-10-CM

## 2021-01-16 LAB — PRO B NATRIURETIC PEPTIDE: NT-Pro BNP: 146 pg/mL (ref 0–301)

## 2021-02-05 DIAGNOSIS — L723 Sebaceous cyst: Secondary | ICD-10-CM | POA: Diagnosis not present

## 2021-02-05 DIAGNOSIS — D224 Melanocytic nevi of scalp and neck: Secondary | ICD-10-CM | POA: Diagnosis not present

## 2021-02-05 DIAGNOSIS — L814 Other melanin hyperpigmentation: Secondary | ICD-10-CM | POA: Diagnosis not present

## 2021-02-05 DIAGNOSIS — L821 Other seborrheic keratosis: Secondary | ICD-10-CM | POA: Diagnosis not present

## 2021-02-06 DIAGNOSIS — Z1389 Encounter for screening for other disorder: Secondary | ICD-10-CM | POA: Diagnosis not present

## 2021-02-06 DIAGNOSIS — M542 Cervicalgia: Secondary | ICD-10-CM | POA: Diagnosis not present

## 2021-02-06 DIAGNOSIS — M5416 Radiculopathy, lumbar region: Secondary | ICD-10-CM | POA: Diagnosis not present

## 2021-02-06 DIAGNOSIS — M549 Dorsalgia, unspecified: Secondary | ICD-10-CM | POA: Diagnosis not present

## 2021-02-06 DIAGNOSIS — G4733 Obstructive sleep apnea (adult) (pediatric): Secondary | ICD-10-CM | POA: Diagnosis not present

## 2021-02-06 DIAGNOSIS — Z0000 Encounter for general adult medical examination without abnormal findings: Secondary | ICD-10-CM | POA: Diagnosis not present

## 2021-02-07 DIAGNOSIS — I700 Atherosclerosis of aorta: Secondary | ICD-10-CM | POA: Diagnosis not present

## 2021-02-11 ENCOUNTER — Other Ambulatory Visit: Payer: Self-pay | Admitting: Family Medicine

## 2021-02-12 ENCOUNTER — Other Ambulatory Visit: Payer: Self-pay | Admitting: Family Medicine

## 2021-02-12 DIAGNOSIS — I700 Atherosclerosis of aorta: Secondary | ICD-10-CM

## 2021-02-18 DIAGNOSIS — E8801 Alpha-1-antitrypsin deficiency: Secondary | ICD-10-CM | POA: Insufficient documentation

## 2021-02-18 DIAGNOSIS — H903 Sensorineural hearing loss, bilateral: Secondary | ICD-10-CM | POA: Diagnosis not present

## 2021-02-18 DIAGNOSIS — H9190 Unspecified hearing loss, unspecified ear: Secondary | ICD-10-CM | POA: Insufficient documentation

## 2021-02-18 DIAGNOSIS — G4733 Obstructive sleep apnea (adult) (pediatric): Secondary | ICD-10-CM | POA: Diagnosis not present

## 2021-02-18 DIAGNOSIS — H8110 Benign paroxysmal vertigo, unspecified ear: Secondary | ICD-10-CM | POA: Diagnosis not present

## 2021-03-03 ENCOUNTER — Ambulatory Visit
Admission: RE | Admit: 2021-03-03 | Discharge: 2021-03-03 | Disposition: A | Discharge status: Home / Self Care | Payer: Medicare HMO | Source: Ambulatory Visit | Attending: Family Medicine | Admitting: Family Medicine

## 2021-03-03 DIAGNOSIS — I700 Atherosclerosis of aorta: Secondary | ICD-10-CM | POA: Diagnosis not present

## 2021-03-10 DIAGNOSIS — D230 Other benign neoplasm of skin of lip: Secondary | ICD-10-CM | POA: Diagnosis not present

## 2021-03-13 DIAGNOSIS — D485 Neoplasm of uncertain behavior of skin: Secondary | ICD-10-CM | POA: Diagnosis not present

## 2021-03-13 DIAGNOSIS — G4733 Obstructive sleep apnea (adult) (pediatric): Secondary | ICD-10-CM | POA: Diagnosis not present

## 2021-04-02 DIAGNOSIS — L239 Allergic contact dermatitis, unspecified cause: Secondary | ICD-10-CM | POA: Diagnosis not present

## 2021-04-02 DIAGNOSIS — L308 Other specified dermatitis: Secondary | ICD-10-CM | POA: Diagnosis not present

## 2021-04-02 DIAGNOSIS — D692 Other nonthrombocytopenic purpura: Secondary | ICD-10-CM | POA: Diagnosis not present

## 2021-04-10 ENCOUNTER — Other Ambulatory Visit (HOSPITAL_BASED_OUTPATIENT_CLINIC_OR_DEPARTMENT_OTHER): Payer: Self-pay

## 2021-04-10 ENCOUNTER — Other Ambulatory Visit: Payer: Self-pay

## 2021-04-10 ENCOUNTER — Ambulatory Visit: Payer: Medicare HMO | Attending: Internal Medicine

## 2021-04-10 DIAGNOSIS — Z23 Encounter for immunization: Secondary | ICD-10-CM

## 2021-04-10 MED ORDER — COVID-19 MRNA VACC (MODERNA) 100 MCG/0.5ML IM SUSP
INTRAMUSCULAR | 0 refills | Status: AC
  Filled 2021-04-10: qty 0.25, 1d supply, fill #0

## 2021-04-10 NOTE — Progress Notes (Signed)
   Covid-19 Vaccination Clinic  Name:  Michele Jones    MRN: [id number] DOB: Apr 24, 1951  04/10/2021  [name] was observed post Covid-19 immunization for 15 minutes without incident. She was provided with Vaccine Information Sheet and instruction to access the V-Safe system.   [name] was instructed to call 911 with any severe reactions post vaccine: [name] Difficulty breathing  . Swelling of face and throat  . A fast heartbeat  . A bad rash all over body  . Dizziness and weakness   Immunizations Administered    Name Date Dose VIS Date Route   Moderna Covid-19 Booster Vaccine 04/10/2021  3:39 PM 0.25 mL 09/18/2020 Intramuscular   Manufacturer: Moderna   Lot: [id number]   [place]: [id number]

## 2021-04-12 DIAGNOSIS — G4733 Obstructive sleep apnea (adult) (pediatric): Secondary | ICD-10-CM | POA: Diagnosis not present

## 2021-05-06 DIAGNOSIS — M1990 Unspecified osteoarthritis, unspecified site: Secondary | ICD-10-CM | POA: Diagnosis not present

## 2021-05-06 DIAGNOSIS — M810 Age-related osteoporosis without current pathological fracture: Secondary | ICD-10-CM | POA: Diagnosis not present

## 2021-05-06 DIAGNOSIS — E782 Mixed hyperlipidemia: Secondary | ICD-10-CM | POA: Diagnosis not present

## 2021-05-06 DIAGNOSIS — K219 Gastro-esophageal reflux disease without esophagitis: Secondary | ICD-10-CM | POA: Diagnosis not present

## 2021-05-06 DIAGNOSIS — M069 Rheumatoid arthritis, unspecified: Secondary | ICD-10-CM | POA: Diagnosis not present

## 2021-05-06 DIAGNOSIS — J45909 Unspecified asthma, uncomplicated: Secondary | ICD-10-CM | POA: Diagnosis not present

## 2021-05-09 DIAGNOSIS — G4733 Obstructive sleep apnea (adult) (pediatric): Secondary | ICD-10-CM | POA: Diagnosis not present

## 2021-05-12 DIAGNOSIS — M810 Age-related osteoporosis without current pathological fracture: Secondary | ICD-10-CM | POA: Diagnosis not present

## 2021-05-12 DIAGNOSIS — K589 Irritable bowel syndrome without diarrhea: Secondary | ICD-10-CM | POA: Diagnosis not present

## 2021-05-12 DIAGNOSIS — I700 Atherosclerosis of aorta: Secondary | ICD-10-CM | POA: Diagnosis not present

## 2021-05-12 DIAGNOSIS — K219 Gastro-esophageal reflux disease without esophagitis: Secondary | ICD-10-CM | POA: Diagnosis not present

## 2021-05-12 DIAGNOSIS — M069 Rheumatoid arthritis, unspecified: Secondary | ICD-10-CM | POA: Diagnosis not present

## 2021-05-12 DIAGNOSIS — E782 Mixed hyperlipidemia: Secondary | ICD-10-CM | POA: Diagnosis not present

## 2021-05-12 DIAGNOSIS — E8801 Alpha-1-antitrypsin deficiency: Secondary | ICD-10-CM | POA: Diagnosis not present

## 2021-05-12 DIAGNOSIS — J45909 Unspecified asthma, uncomplicated: Secondary | ICD-10-CM | POA: Diagnosis not present

## 2021-05-12 DIAGNOSIS — I471 Supraventricular tachycardia: Secondary | ICD-10-CM | POA: Diagnosis not present

## 2021-05-12 DIAGNOSIS — G4733 Obstructive sleep apnea (adult) (pediatric): Secondary | ICD-10-CM | POA: Diagnosis not present

## 2021-05-13 DIAGNOSIS — G4733 Obstructive sleep apnea (adult) (pediatric): Secondary | ICD-10-CM | POA: Diagnosis not present

## 2021-06-03 ENCOUNTER — Other Ambulatory Visit: Payer: Self-pay | Admitting: Family Medicine

## 2021-06-03 DIAGNOSIS — E042 Nontoxic multinodular goiter: Secondary | ICD-10-CM

## 2021-06-03 DIAGNOSIS — L820 Inflamed seborrheic keratosis: Secondary | ICD-10-CM | POA: Diagnosis not present

## 2021-06-03 DIAGNOSIS — L821 Other seborrheic keratosis: Secondary | ICD-10-CM | POA: Diagnosis not present

## 2021-06-03 DIAGNOSIS — L738 Other specified follicular disorders: Secondary | ICD-10-CM | POA: Diagnosis not present

## 2021-06-06 DIAGNOSIS — R591 Generalized enlarged lymph nodes: Secondary | ICD-10-CM | POA: Diagnosis not present

## 2021-06-08 DIAGNOSIS — G4733 Obstructive sleep apnea (adult) (pediatric): Secondary | ICD-10-CM | POA: Diagnosis not present

## 2021-06-11 DIAGNOSIS — G4733 Obstructive sleep apnea (adult) (pediatric): Secondary | ICD-10-CM | POA: Diagnosis not present

## 2021-06-13 ENCOUNTER — Ambulatory Visit
Admission: RE | Admit: 2021-06-13 | Discharge: 2021-06-13 | Disposition: A | Discharge status: Home / Self Care | Payer: Medicare HMO | Source: Ambulatory Visit | Attending: Family Medicine | Admitting: Family Medicine

## 2021-06-13 DIAGNOSIS — E041 Nontoxic single thyroid nodule: Secondary | ICD-10-CM | POA: Diagnosis not present

## 2021-06-13 DIAGNOSIS — E042 Nontoxic multinodular goiter: Secondary | ICD-10-CM

## 2021-06-16 DIAGNOSIS — M797 Fibromyalgia: Secondary | ICD-10-CM | POA: Diagnosis not present

## 2021-06-16 DIAGNOSIS — M3500 Sicca syndrome, unspecified: Secondary | ICD-10-CM | POA: Diagnosis not present

## 2021-06-16 DIAGNOSIS — Z681 Body mass index (BMI) 19 or less, adult: Secondary | ICD-10-CM | POA: Diagnosis not present

## 2021-06-16 DIAGNOSIS — I7300 Raynaud's syndrome without gangrene: Secondary | ICD-10-CM | POA: Diagnosis not present

## 2021-06-16 DIAGNOSIS — M2550 Pain in unspecified joint: Secondary | ICD-10-CM | POA: Diagnosis not present

## 2021-06-16 DIAGNOSIS — M150 Primary generalized (osteo)arthritis: Secondary | ICD-10-CM | POA: Diagnosis not present

## 2021-06-27 DIAGNOSIS — Z23 Encounter for immunization: Secondary | ICD-10-CM | POA: Diagnosis not present

## 2021-07-07 ENCOUNTER — Other Ambulatory Visit: Payer: Self-pay | Admitting: Cardiology

## 2021-07-09 DIAGNOSIS — G4733 Obstructive sleep apnea (adult) (pediatric): Secondary | ICD-10-CM | POA: Diagnosis not present

## 2021-07-25 DIAGNOSIS — E782 Mixed hyperlipidemia: Secondary | ICD-10-CM | POA: Diagnosis not present

## 2021-07-25 DIAGNOSIS — J45909 Unspecified asthma, uncomplicated: Secondary | ICD-10-CM | POA: Diagnosis not present

## 2021-07-25 DIAGNOSIS — M1990 Unspecified osteoarthritis, unspecified site: Secondary | ICD-10-CM | POA: Diagnosis not present

## 2021-07-25 DIAGNOSIS — K219 Gastro-esophageal reflux disease without esophagitis: Secondary | ICD-10-CM | POA: Diagnosis not present

## 2021-07-25 DIAGNOSIS — M810 Age-related osteoporosis without current pathological fracture: Secondary | ICD-10-CM | POA: Diagnosis not present

## 2021-07-25 DIAGNOSIS — M069 Rheumatoid arthritis, unspecified: Secondary | ICD-10-CM | POA: Diagnosis not present

## 2021-08-01 DIAGNOSIS — I4891 Unspecified atrial fibrillation: Secondary | ICD-10-CM | POA: Diagnosis not present

## 2021-08-01 DIAGNOSIS — D649 Anemia, unspecified: Secondary | ICD-10-CM | POA: Diagnosis not present

## 2021-08-01 DIAGNOSIS — D509 Iron deficiency anemia, unspecified: Secondary | ICD-10-CM | POA: Diagnosis not present

## 2021-08-01 DIAGNOSIS — N959 Unspecified menopausal and perimenopausal disorder: Secondary | ICD-10-CM | POA: Diagnosis not present

## 2021-08-01 DIAGNOSIS — M1990 Unspecified osteoarthritis, unspecified site: Secondary | ICD-10-CM | POA: Diagnosis not present

## 2021-08-01 DIAGNOSIS — G4730 Sleep apnea, unspecified: Secondary | ICD-10-CM | POA: Diagnosis not present

## 2021-08-01 DIAGNOSIS — E042 Nontoxic multinodular goiter: Secondary | ICD-10-CM | POA: Diagnosis not present

## 2021-08-01 DIAGNOSIS — R5381 Other malaise: Secondary | ICD-10-CM | POA: Diagnosis not present

## 2021-08-05 ENCOUNTER — Telehealth: Payer: Self-pay | Admitting: *Deleted

## 2021-08-05 ENCOUNTER — Telehealth (INDEPENDENT_AMBULATORY_CARE_PROVIDER_SITE_OTHER): Payer: Medicare HMO | Admitting: Cardiology

## 2021-08-05 ENCOUNTER — Other Ambulatory Visit: Payer: Self-pay

## 2021-08-05 ENCOUNTER — Encounter: Payer: Self-pay | Admitting: Cardiology

## 2021-08-05 VITALS — BP 125/61 | HR 76 | Ht 67.0 in | Wt 108.0 lb

## 2021-08-05 DIAGNOSIS — R600 Localized edema: Secondary | ICD-10-CM | POA: Diagnosis not present

## 2021-08-05 DIAGNOSIS — G4733 Obstructive sleep apnea (adult) (pediatric): Secondary | ICD-10-CM | POA: Diagnosis not present

## 2021-08-05 DIAGNOSIS — I493 Ventricular premature depolarization: Secondary | ICD-10-CM

## 2021-08-05 DIAGNOSIS — R0789 Other chest pain: Secondary | ICD-10-CM

## 2021-08-05 DIAGNOSIS — I700 Atherosclerosis of aorta: Secondary | ICD-10-CM

## 2021-08-05 DIAGNOSIS — I471 Supraventricular tachycardia: Secondary | ICD-10-CM | POA: Diagnosis not present

## 2021-08-05 MED ORDER — DILTIAZEM HCL ER COATED BEADS 180 MG PO CP24: ORAL_CAPSULE | ORAL | 3 refills | Status: DC

## 2021-08-05 MED ORDER — DILTIAZEM HCL 30 MG PO TABS: 30.0000 mg | ORAL_TABLET | Freq: Every day | ORAL | 3 refills | Status: DC | PRN

## 2021-08-05 NOTE — Telephone Encounter (Signed)
  Patient Consent for Virtual Visit        Michele Jones has provided verbal consent on 08/05/2021 for a virtual visit (video or telephone).   CONSENT FOR VIRTUAL VISIT FOR:  Michele Jones  By participating in this virtual visit I agree to the following:  I hereby voluntarily request, consent and authorize [facility] and its employed or contracted physicians, physician assistants, nurse practitioners or other licensed health care professionals (the Practitioner), to provide me with telemedicine health care services (the "Services") as deemed necessary by the treating Practitioner. I acknowledge and consent to receive the Services by the Practitioner via telemedicine. I understand that the telemedicine visit will involve communicating with the Practitioner through live audiovisual communication technology and the disclosure of certain medical information by electronic transmission. I acknowledge that I have been given the opportunity to request an in-person assessment or other available alternative prior to the telemedicine visit and am voluntarily participating in the telemedicine visit.  I understand that I have the right to withhold or withdraw my consent to the use of telemedicine in the course of my care at any time, without affecting my right to future care or treatment, and that the Practitioner or I may terminate the telemedicine visit at any time. I understand that I have the right to inspect all information obtained and/or recorded in the course of the telemedicine visit and may receive copies of available information for a reasonable fee.  I understand that some of the potential risks of receiving the Services via telemedicine include:  Delay or interruption in medical evaluation due to technological equipment failure or disruption; Information transmitted may not be sufficient (e.g. poor resolution of images) to allow for appropriate medical decision making by the Practitioner;  and/or  In rare instances, security protocols could fail, causing a breach of personal health information.  Furthermore, I acknowledge that it is my responsibility to provide information about my medical history, conditions and care that is complete and accurate to the best of my ability. I acknowledge that Practitioner's advice, recommendations, and/or decision may be based on factors not within their control, such as incomplete or inaccurate data provided by me or distortions of diagnostic images or specimens that may result from electronic transmissions. I understand that the practice of medicine is not an exact science and that Practitioner makes no warranties or guarantees regarding treatment outcomes. I acknowledge that a copy of this consent can be made available to me via my patient portal ([facility]), or I can request a printed copy by calling the office of [facility].    I understand that my insurance will be billed for this visit.   I have read or had this consent read to me. I understand the contents of this consent, which adequately explains the benefits and risks of the Services being provided via telemedicine.  I have been provided ample opportunity to ask questions regarding this consent and the Services and have had my questions answered to my satisfaction. I give my informed consent for the services to be provided through the use of telemedicine in my medical care

## 2021-08-05 NOTE — Progress Notes (Signed)
Virtual Visit via Video Note   This visit type was conducted due to national recommendations for restrictions regarding the COVID-19 Pandemic (e.g. social distancing) in an effort to limit this patient's exposure and mitigate transmission in our community.  Due to her co-morbid illnesses, this patient is at least at moderate risk for complications without adequate follow up.  This format is felt to be most appropriate for this patient at this time.  All issues noted in this document were discussed and addressed.  A limited physical exam was performed with this format.  Please refer to the patient's chart for her consent to telehealth for [facility].  Date:  08/05/2021   ID:  Michele Jones, DOB 11-26-1951, MRN [id number] The patient was identified using 2 identifiers.  Patient Location: Home Provider Location: Office/Clinic   PCP:  Michele Shanks, MD   [facility] Providers Cardiologist:  Michele Him, MD     Evaluation Performed:  Follow-Up Visit  Chief Complaint:  OSA, PVCs, PAT  History of Present Illness:    Michele Jones is a 70 y.o. female with a hx of symptomatic PVC's and PAC's, nonsustained atrial tachycardia, mild OSA on CPAP and diastolic dysfunction.  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  She has a history of RBBB and nuclear stress test 11/2016 was normal.  2D echo 2017 showed normal LVF .    She is here today for followup and is doing well.  She is occasionally has some DOE when she has a bronchiectasis/bronchitis exacerbation.  She denies any chest pain or pressure, PND, orthopnea (except with her bronchiectasis exacerbations), LE edema, dizziness, palpitations (except when she has an exacerbation of her bronchiectasis) or syncope. She is compliant with her meds and is tolerating meds with no SE.     She is doing well with her CPAP device and thinks that she has gotten  used to it.  She tolerates the nasal mask and feels the pressure is adequate.  She feels groggy in the am and goes to bed at 10:30pm and gets up at 7:30-8am.  She gets up 1 time nightly.  She has had some problems with mouth dryness.  She does not think that he snores.   She had an episode a epistaxis one night last week.  The patient does not have symptoms concerning for COVID-19 infection (fever, chills, cough, or new shortness of breath).    Past Medical History:  Diagnosis Date   Allergy    Aortic atherosclerosis ([place])    noted in abdominal aorta by CT 09/2020   Bronchiectasis (HCC)    COPD (chronic obstructive pulmonary disease) (HCC)    alpha 1 antitrypsin deficiency   Fibromyalgia    Malaria    OSA (obstructive sleep apnea)    mild with AHI 5.8/hr now on CPAP at 10cm H2o   Osteoporosis    PAC (premature atrial contraction)    Paroxysmal atrial tachycardia (HCC)    PVC's (premature ventricular contractions)    RBBB    no ischemia on nuclear stree test   Past Surgical History:  Procedure Laterality Date   APPENDECTOMY     TEMPOROMANDIBULAR JOINT SURGERY       Current Meds  Medication Sig   ANORO ELLIPTA 62.5-25 MCG/INH AEPB Inhale 1 Dose into the lungs daily.   azithromycin (ZITHROMAX) 500 MG tablet Take 1 tablet by mouth 3 (three) times  a week.   b complex vitamins tablet Take 1 tablet by mouth daily.   Boron 3 MG CAPS Take by mouth daily.   Calcium-Vitamin A-Vitamin D (LIQUID CALCIUM PO) Take 5 mLs by mouth daily.   ciprofloxacin (CIPRO) 500 MG tablet Take 500 mg by mouth as needed.   COVID-19 mRNA vaccine, Moderna, 100 MCG/0.5ML injection Inject into the muscle.   diltiazem (CARDIZEM CD) 180 MG 24 hr capsule TAKE 1 CAPSULE BY MOUTH EVERY DAY   diltiazem (CARDIZEM) 30 MG tablet TAKE 1 TABLET (30 MG TOTAL) BY MOUTH DAILY AS NEEDED. FOR PALPITATIONS   furosemide (LASIX) 20 MG tablet Take 1 tablet (20 mg total) by mouth daily as needed.   glucosamine-chondroitin 500-400  MG tablet Take 1 tablet by mouth daily.    Grape Seed 100 MG CAPS Take by mouth 2 (two) times daily.   Multiple Vitamins-Minerals (MULTIVITAMIN WITH MINERALS) tablet Take 1 tablet by mouth daily.   Olive Leaf Extract 150 MG CAPS Take by mouth daily.   rosuvastatin (CRESTOR) 5 MG tablet Take 5 mg by mouth once a week.   zolpidem (AMBIEN) 10 MG tablet Take 10 mg by mouth at bedtime as needed for sleep.     Allergies:   Amoxicillin, Molds & smuts, Septra [sulfamethoxazole-trimethoprim], Augmentin [amoxicillin-pot clavulanate], Latex, Levaquin [levofloxacin in d5w], Levofloxacin, Penicillins, Sulfamethoxazole-trimethoprim, and Tetracycline   Social History   Tobacco Use   Smoking status: Never   Smokeless tobacco: Never  Vaping Use   Vaping Use: Never used  Substance Use Topics   Alcohol use: No    Alcohol/week: 0.0 standard drinks   Drug use: No     Family Hx: The patient's family history includes Cancer in her father and maternal grandmother; Diabetes in her paternal grandmother; Hypertension in her brother; Stroke in her mother.  ROS:   Please see the history of present illness.     All other systems reviewed and are negative.   Prior CV studies:   The following studies were reviewed today:  PAP compliance download  Labs/Other Tests and Data Reviewed:    EKG:  No ECG reviewed.  Recent Labs: 01/15/2021: NT-Pro BNP 146   Recent Lipid Panel No results found for: CHOL, TRIG, HDL, CHOLHDL, LDLCALC, LDLDIRECT  Wt Readings from Last 3 Encounters:  08/05/21 108 lb (49 kg)  12/18/20 110 lb 9.6 oz (50.2 kg)  06/24/20 111 lb 9.6 oz (50.6 kg)     Risk Assessment/Calculations:    Objective:    Vital Signs:  BP 125/61   Pulse 76   Ht '5\' 7"'$  (1.702 m)   Wt 108 lb (49 kg)   BMI 16.92 kg/m    VITAL SIGNS:  reviewed GEN:  no acute distress EYES:  sclerae anicteric, EOMI - Extraocular Movements Intact RESPIRATORY:  normal respiratory effort, symmetric  expansion CARDIOVASCULAR:  no peripheral edema SKIN:  no rash, lesions or ulcers. MUSCULOSKELETAL:  no obvious deformities. NEURO:  alert and oriented x 3, no obvious focal deficit PSYCH:  normal affect  ASSESSMENT & PLAN:    1.  OSA - The patient is tolerating PAP therapy well without any problems. The PAP download performed by his DME was personally reviewed and interpreted by me today and showed an AHI of 0.3/hr on 10 cm H2O with 100% compliance in using more than 4 hours nightly.  The patient has been using and benefiting from PAP use and will continue to benefit from therapy.  -she is having problems with dry mouth  and has tried the chin strap and a FFM but is too claustrophobic for it -I have encouraged her to turn up her humidity to avoid nosebleeds    2.  PAT -She has not had any palpitations since I the Spring when she had an exacerbation of her bronchiectasis -Continue prescription drug management with Cardizem CD '180mg'$  daily and short acting Cardizem '30mg'$  PRN for breakthrough>refilled   3.  PVCs -suppressed with Cardizem -continue Cardizem   4.  Chronic atypical CP and DOE -likely related to her underlying bronchiectasis -coronary CTA showed no CAD in 2019   5.  LE edema -2D echo 12/2020 showed normal LVF and Diastolic dysfunction -Continue prescription drug management with Lasix '20mg'$  PRN > refilled  -encouraged to follow a 2gm Na diet   6.  Aortic atherosclerosis -noted in abdominal aorta by CT 09/2020 -started on statin per PCP     COVID-19 Education: The signs and symptoms of COVID-19 were discussed with the patient and how to seek care for testing (follow up with PCP or arrange E-visit).  The importance of social distancing was discussed today.  Time:   Today, I have spent 20 minutes with the patient with telehealth technology discussing the above problems.     Medication Adjustments/Labs and Tests Ordered: Current medicines are reviewed at length with the  patient today.  Concerns regarding medicines are outlined above.   Tests Ordered: No orders of the defined types were placed in this encounter.   Medication Changes: No orders of the defined types were placed in this encounter.   Follow Up:  In Person in 1 year(s)  Signed, Michele Him, MD  08/05/2021 9:21 AM    [place]

## 2021-08-05 NOTE — Patient Instructions (Signed)

## 2021-08-09 DIAGNOSIS — G4733 Obstructive sleep apnea (adult) (pediatric): Secondary | ICD-10-CM | POA: Diagnosis not present

## 2021-08-14 DIAGNOSIS — M810 Age-related osteoporosis without current pathological fracture: Secondary | ICD-10-CM | POA: Diagnosis not present

## 2021-08-21 DIAGNOSIS — J449 Chronic obstructive pulmonary disease, unspecified: Secondary | ICD-10-CM | POA: Diagnosis not present

## 2021-08-21 DIAGNOSIS — M797 Fibromyalgia: Secondary | ICD-10-CM | POA: Diagnosis not present

## 2021-08-21 DIAGNOSIS — E8801 Alpha-1-antitrypsin deficiency: Secondary | ICD-10-CM | POA: Diagnosis not present

## 2021-08-21 DIAGNOSIS — G4733 Obstructive sleep apnea (adult) (pediatric): Secondary | ICD-10-CM | POA: Diagnosis not present

## 2021-08-21 DIAGNOSIS — E049 Nontoxic goiter, unspecified: Secondary | ICD-10-CM | POA: Diagnosis not present

## 2021-08-21 DIAGNOSIS — E785 Hyperlipidemia, unspecified: Secondary | ICD-10-CM | POA: Diagnosis not present

## 2021-08-21 DIAGNOSIS — M810 Age-related osteoporosis without current pathological fracture: Secondary | ICD-10-CM | POA: Diagnosis not present

## 2021-08-21 DIAGNOSIS — I471 Supraventricular tachycardia: Secondary | ICD-10-CM | POA: Diagnosis not present

## 2021-08-21 DIAGNOSIS — Z23 Encounter for immunization: Secondary | ICD-10-CM | POA: Diagnosis not present

## 2021-08-21 DIAGNOSIS — I700 Atherosclerosis of aorta: Secondary | ICD-10-CM | POA: Diagnosis not present

## 2021-08-22 DIAGNOSIS — L738 Other specified follicular disorders: Secondary | ICD-10-CM | POA: Diagnosis not present

## 2021-08-22 DIAGNOSIS — L821 Other seborrheic keratosis: Secondary | ICD-10-CM | POA: Diagnosis not present

## 2021-08-28 DIAGNOSIS — B379 Candidiasis, unspecified: Secondary | ICD-10-CM | POA: Diagnosis not present

## 2021-08-28 DIAGNOSIS — G4700 Insomnia, unspecified: Secondary | ICD-10-CM | POA: Diagnosis not present

## 2021-08-28 DIAGNOSIS — J069 Acute upper respiratory infection, unspecified: Secondary | ICD-10-CM | POA: Diagnosis not present

## 2021-08-28 DIAGNOSIS — E785 Hyperlipidemia, unspecified: Secondary | ICD-10-CM | POA: Diagnosis not present

## 2021-08-28 DIAGNOSIS — I10 Essential (primary) hypertension: Secondary | ICD-10-CM | POA: Diagnosis not present

## 2021-08-28 DIAGNOSIS — G8929 Other chronic pain: Secondary | ICD-10-CM | POA: Diagnosis not present

## 2021-08-28 DIAGNOSIS — I499 Cardiac arrhythmia, unspecified: Secondary | ICD-10-CM | POA: Diagnosis not present

## 2021-08-28 DIAGNOSIS — E46 Unspecified protein-calorie malnutrition: Secondary | ICD-10-CM | POA: Diagnosis not present

## 2021-08-28 DIAGNOSIS — G4733 Obstructive sleep apnea (adult) (pediatric): Secondary | ICD-10-CM | POA: Diagnosis not present

## 2021-08-28 DIAGNOSIS — H2520 Age-related cataract, morgagnian type, unspecified eye: Secondary | ICD-10-CM | POA: Diagnosis not present

## 2021-08-28 DIAGNOSIS — J449 Chronic obstructive pulmonary disease, unspecified: Secondary | ICD-10-CM | POA: Diagnosis not present

## 2021-08-28 DIAGNOSIS — D649 Anemia, unspecified: Secondary | ICD-10-CM | POA: Diagnosis not present

## 2021-09-08 DIAGNOSIS — G4733 Obstructive sleep apnea (adult) (pediatric): Secondary | ICD-10-CM | POA: Diagnosis not present

## 2021-09-14 DIAGNOSIS — M069 Rheumatoid arthritis, unspecified: Secondary | ICD-10-CM | POA: Diagnosis not present

## 2021-09-14 DIAGNOSIS — M810 Age-related osteoporosis without current pathological fracture: Secondary | ICD-10-CM | POA: Diagnosis not present

## 2021-09-14 DIAGNOSIS — K219 Gastro-esophageal reflux disease without esophagitis: Secondary | ICD-10-CM | POA: Diagnosis not present

## 2021-09-14 DIAGNOSIS — J479 Bronchiectasis, uncomplicated: Secondary | ICD-10-CM | POA: Diagnosis not present

## 2021-09-14 DIAGNOSIS — M1990 Unspecified osteoarthritis, unspecified site: Secondary | ICD-10-CM | POA: Diagnosis not present

## 2021-09-14 DIAGNOSIS — E782 Mixed hyperlipidemia: Secondary | ICD-10-CM | POA: Diagnosis not present

## 2021-09-15 DIAGNOSIS — J324 Chronic pansinusitis: Secondary | ICD-10-CM | POA: Diagnosis not present

## 2021-09-15 DIAGNOSIS — M069 Rheumatoid arthritis, unspecified: Secondary | ICD-10-CM | POA: Diagnosis not present

## 2021-09-15 DIAGNOSIS — I700 Atherosclerosis of aorta: Secondary | ICD-10-CM | POA: Diagnosis not present

## 2021-09-15 DIAGNOSIS — K589 Irritable bowel syndrome without diarrhea: Secondary | ICD-10-CM | POA: Diagnosis not present

## 2021-09-15 DIAGNOSIS — J479 Bronchiectasis, uncomplicated: Secondary | ICD-10-CM | POA: Diagnosis not present

## 2021-09-15 DIAGNOSIS — K219 Gastro-esophageal reflux disease without esophagitis: Secondary | ICD-10-CM | POA: Diagnosis not present

## 2021-09-15 DIAGNOSIS — I471 Supraventricular tachycardia: Secondary | ICD-10-CM | POA: Diagnosis not present

## 2021-09-15 DIAGNOSIS — G4733 Obstructive sleep apnea (adult) (pediatric): Secondary | ICD-10-CM | POA: Diagnosis not present

## 2021-09-15 DIAGNOSIS — E782 Mixed hyperlipidemia: Secondary | ICD-10-CM | POA: Diagnosis not present

## 2021-09-15 DIAGNOSIS — M810 Age-related osteoporosis without current pathological fracture: Secondary | ICD-10-CM | POA: Diagnosis not present

## 2021-09-15 DIAGNOSIS — E8801 Alpha-1-antitrypsin deficiency: Secondary | ICD-10-CM | POA: Diagnosis not present

## 2021-09-18 DIAGNOSIS — G4733 Obstructive sleep apnea (adult) (pediatric): Secondary | ICD-10-CM | POA: Diagnosis not present

## 2021-09-18 DIAGNOSIS — J479 Bronchiectasis, uncomplicated: Secondary | ICD-10-CM | POA: Diagnosis not present

## 2021-09-18 DIAGNOSIS — R911 Solitary pulmonary nodule: Secondary | ICD-10-CM | POA: Diagnosis not present

## 2021-09-18 DIAGNOSIS — J984 Other disorders of lung: Secondary | ICD-10-CM | POA: Diagnosis not present

## 2021-09-22 DIAGNOSIS — Z1231 Encounter for screening mammogram for malignant neoplasm of breast: Secondary | ICD-10-CM | POA: Diagnosis not present

## 2021-09-24 DIAGNOSIS — R5381 Other malaise: Secondary | ICD-10-CM | POA: Diagnosis not present

## 2021-09-24 DIAGNOSIS — E039 Hypothyroidism, unspecified: Secondary | ICD-10-CM | POA: Diagnosis not present

## 2021-09-25 DIAGNOSIS — G4733 Obstructive sleep apnea (adult) (pediatric): Secondary | ICD-10-CM | POA: Diagnosis not present

## 2021-09-25 DIAGNOSIS — E8801 Alpha-1-antitrypsin deficiency: Secondary | ICD-10-CM | POA: Diagnosis not present

## 2021-09-25 DIAGNOSIS — R5382 Chronic fatigue, unspecified: Secondary | ICD-10-CM | POA: Diagnosis not present

## 2021-09-26 ENCOUNTER — Other Ambulatory Visit: Payer: Self-pay | Admitting: Family Medicine

## 2021-09-26 DIAGNOSIS — J479 Bronchiectasis, uncomplicated: Secondary | ICD-10-CM | POA: Diagnosis not present

## 2021-09-26 DIAGNOSIS — G4733 Obstructive sleep apnea (adult) (pediatric): Secondary | ICD-10-CM | POA: Diagnosis not present

## 2021-09-26 DIAGNOSIS — E049 Nontoxic goiter, unspecified: Secondary | ICD-10-CM | POA: Diagnosis not present

## 2021-09-26 DIAGNOSIS — E785 Hyperlipidemia, unspecified: Secondary | ICD-10-CM | POA: Diagnosis not present

## 2021-09-26 DIAGNOSIS — I471 Supraventricular tachycardia: Secondary | ICD-10-CM | POA: Diagnosis not present

## 2021-09-26 DIAGNOSIS — R590 Localized enlarged lymph nodes: Secondary | ICD-10-CM

## 2021-09-26 DIAGNOSIS — M797 Fibromyalgia: Secondary | ICD-10-CM | POA: Diagnosis not present

## 2021-09-26 DIAGNOSIS — I700 Atherosclerosis of aorta: Secondary | ICD-10-CM | POA: Diagnosis not present

## 2021-09-26 DIAGNOSIS — E8801 Alpha-1-antitrypsin deficiency: Secondary | ICD-10-CM | POA: Diagnosis not present

## 2021-10-09 DIAGNOSIS — G4733 Obstructive sleep apnea (adult) (pediatric): Secondary | ICD-10-CM | POA: Diagnosis not present

## 2021-10-13 ENCOUNTER — Ambulatory Visit
Admission: RE | Admit: 2021-10-13 | Discharge: 2021-10-13 | Disposition: A | Discharge status: Home / Self Care | Payer: Medicare HMO | Source: Ambulatory Visit | Attending: Family Medicine | Admitting: Family Medicine

## 2021-10-13 DIAGNOSIS — R590 Localized enlarged lymph nodes: Secondary | ICD-10-CM | POA: Diagnosis not present

## 2021-10-16 DIAGNOSIS — M150 Primary generalized (osteo)arthritis: Secondary | ICD-10-CM | POA: Diagnosis not present

## 2021-10-16 DIAGNOSIS — I7300 Raynaud's syndrome without gangrene: Secondary | ICD-10-CM | POA: Diagnosis not present

## 2021-10-16 DIAGNOSIS — M810 Age-related osteoporosis without current pathological fracture: Secondary | ICD-10-CM | POA: Diagnosis not present

## 2021-10-16 DIAGNOSIS — M797 Fibromyalgia: Secondary | ICD-10-CM | POA: Diagnosis not present

## 2021-10-16 DIAGNOSIS — L739 Follicular disorder, unspecified: Secondary | ICD-10-CM | POA: Diagnosis not present

## 2021-10-16 DIAGNOSIS — M3500 Sicca syndrome, unspecified: Secondary | ICD-10-CM | POA: Diagnosis not present

## 2021-10-16 DIAGNOSIS — Z681 Body mass index (BMI) 19 or less, adult: Secondary | ICD-10-CM | POA: Diagnosis not present

## 2021-10-16 DIAGNOSIS — M546 Pain in thoracic spine: Secondary | ICD-10-CM | POA: Diagnosis not present

## 2021-10-16 DIAGNOSIS — M2550 Pain in unspecified joint: Secondary | ICD-10-CM | POA: Diagnosis not present

## 2021-11-04 DIAGNOSIS — K118 Other diseases of salivary glands: Secondary | ICD-10-CM | POA: Insufficient documentation

## 2021-11-04 DIAGNOSIS — H9190 Unspecified hearing loss, unspecified ear: Secondary | ICD-10-CM | POA: Diagnosis not present

## 2021-11-08 DIAGNOSIS — G4733 Obstructive sleep apnea (adult) (pediatric): Secondary | ICD-10-CM | POA: Diagnosis not present

## 2022-01-23 ENCOUNTER — Other Ambulatory Visit: Payer: Self-pay | Admitting: Cardiology

## 2022-03-20 DIAGNOSIS — H9190 Unspecified hearing loss, unspecified ear: Secondary | ICD-10-CM | POA: Diagnosis not present

## 2022-03-20 DIAGNOSIS — E8801 Alpha-1-antitrypsin deficiency: Secondary | ICD-10-CM | POA: Diagnosis not present

## 2022-03-20 DIAGNOSIS — J310 Chronic rhinitis: Secondary | ICD-10-CM | POA: Diagnosis not present

## 2022-04-03 DIAGNOSIS — M810 Age-related osteoporosis without current pathological fracture: Secondary | ICD-10-CM | POA: Diagnosis not present

## 2022-04-03 DIAGNOSIS — E8801 Alpha-1-antitrypsin deficiency: Secondary | ICD-10-CM | POA: Diagnosis not present

## 2022-04-03 DIAGNOSIS — J479 Bronchiectasis, uncomplicated: Secondary | ICD-10-CM | POA: Diagnosis not present

## 2022-04-03 DIAGNOSIS — G4733 Obstructive sleep apnea (adult) (pediatric): Secondary | ICD-10-CM | POA: Diagnosis not present

## 2022-04-03 DIAGNOSIS — E049 Nontoxic goiter, unspecified: Secondary | ICD-10-CM | POA: Diagnosis not present

## 2022-04-03 DIAGNOSIS — J449 Chronic obstructive pulmonary disease, unspecified: Secondary | ICD-10-CM | POA: Diagnosis not present

## 2022-04-03 DIAGNOSIS — I700 Atherosclerosis of aorta: Secondary | ICD-10-CM | POA: Diagnosis not present

## 2022-04-03 DIAGNOSIS — M797 Fibromyalgia: Secondary | ICD-10-CM | POA: Diagnosis not present

## 2022-04-03 DIAGNOSIS — E785 Hyperlipidemia, unspecified: Secondary | ICD-10-CM | POA: Diagnosis not present

## 2022-04-22 ENCOUNTER — Other Ambulatory Visit: Payer: Self-pay | Admitting: Cardiology

## 2022-05-07 DIAGNOSIS — M069 Rheumatoid arthritis, unspecified: Secondary | ICD-10-CM | POA: Diagnosis not present

## 2022-05-07 DIAGNOSIS — M810 Age-related osteoporosis without current pathological fracture: Secondary | ICD-10-CM | POA: Diagnosis not present

## 2022-05-07 DIAGNOSIS — K219 Gastro-esophageal reflux disease without esophagitis: Secondary | ICD-10-CM | POA: Diagnosis not present

## 2022-05-07 DIAGNOSIS — E782 Mixed hyperlipidemia: Secondary | ICD-10-CM | POA: Diagnosis not present

## 2022-05-26 DIAGNOSIS — M069 Rheumatoid arthritis, unspecified: Secondary | ICD-10-CM | POA: Diagnosis not present

## 2022-05-26 DIAGNOSIS — E8801 Alpha-1-antitrypsin deficiency: Secondary | ICD-10-CM | POA: Diagnosis not present

## 2022-05-26 DIAGNOSIS — E782 Mixed hyperlipidemia: Secondary | ICD-10-CM | POA: Diagnosis not present

## 2022-05-26 DIAGNOSIS — M810 Age-related osteoporosis without current pathological fracture: Secondary | ICD-10-CM | POA: Diagnosis not present

## 2022-05-26 DIAGNOSIS — I471 Supraventricular tachycardia: Secondary | ICD-10-CM | POA: Diagnosis not present

## 2022-05-26 DIAGNOSIS — D649 Anemia, unspecified: Secondary | ICD-10-CM | POA: Diagnosis not present

## 2022-05-26 DIAGNOSIS — J320 Chronic maxillary sinusitis: Secondary | ICD-10-CM | POA: Diagnosis not present

## 2022-05-26 DIAGNOSIS — M3500 Sicca syndrome, unspecified: Secondary | ICD-10-CM | POA: Diagnosis not present

## 2022-05-27 DIAGNOSIS — L218 Other seborrheic dermatitis: Secondary | ICD-10-CM | POA: Diagnosis not present

## 2022-05-27 DIAGNOSIS — D692 Other nonthrombocytopenic purpura: Secondary | ICD-10-CM | POA: Diagnosis not present

## 2022-05-27 DIAGNOSIS — L821 Other seborrheic keratosis: Secondary | ICD-10-CM | POA: Diagnosis not present

## 2022-06-16 DIAGNOSIS — M1991 Primary osteoarthritis, unspecified site: Secondary | ICD-10-CM | POA: Diagnosis not present

## 2022-06-16 DIAGNOSIS — Z681 Body mass index (BMI) 19 or less, adult: Secondary | ICD-10-CM | POA: Diagnosis not present

## 2022-06-16 DIAGNOSIS — M3501 Sicca syndrome with keratoconjunctivitis: Secondary | ICD-10-CM | POA: Diagnosis not present

## 2022-06-16 DIAGNOSIS — M3500 Sicca syndrome, unspecified: Secondary | ICD-10-CM | POA: Diagnosis not present

## 2022-06-16 DIAGNOSIS — I7300 Raynaud's syndrome without gangrene: Secondary | ICD-10-CM | POA: Diagnosis not present

## 2022-06-16 DIAGNOSIS — M797 Fibromyalgia: Secondary | ICD-10-CM | POA: Diagnosis not present

## 2022-07-23 ENCOUNTER — Other Ambulatory Visit: Payer: Self-pay | Admitting: Cardiology

## 2022-08-02 ENCOUNTER — Other Ambulatory Visit: Payer: Self-pay | Admitting: Cardiology

## 2022-08-04 DIAGNOSIS — M1991 Primary osteoarthritis, unspecified site: Secondary | ICD-10-CM | POA: Insufficient documentation

## 2022-08-04 DIAGNOSIS — R911 Solitary pulmonary nodule: Secondary | ICD-10-CM | POA: Insufficient documentation

## 2022-08-04 DIAGNOSIS — M3501 Sicca syndrome with keratoconjunctivitis: Secondary | ICD-10-CM | POA: Insufficient documentation

## 2022-08-10 ENCOUNTER — Telehealth: Payer: Self-pay | Admitting: Cardiology

## 2022-08-10 ENCOUNTER — Encounter (HOSPITAL_COMMUNITY): Payer: Self-pay

## 2022-08-10 ENCOUNTER — Emergency Department (HOSPITAL_COMMUNITY)
Admission: EM | Admit: 2022-08-10 | Discharge: 2022-08-11 | Disposition: A | Discharge status: Home / Self Care | Payer: Medicare PPO | Attending: Emergency Medicine | Admitting: Emergency Medicine

## 2022-08-10 ENCOUNTER — Emergency Department (HOSPITAL_COMMUNITY): Payer: Medicare PPO

## 2022-08-10 ENCOUNTER — Other Ambulatory Visit: Payer: Self-pay

## 2022-08-10 DIAGNOSIS — R079 Chest pain, unspecified: Secondary | ICD-10-CM | POA: Diagnosis not present

## 2022-08-10 DIAGNOSIS — J449 Chronic obstructive pulmonary disease, unspecified: Secondary | ICD-10-CM | POA: Diagnosis not present

## 2022-08-10 DIAGNOSIS — J439 Emphysema, unspecified: Secondary | ICD-10-CM | POA: Diagnosis not present

## 2022-08-10 DIAGNOSIS — R0789 Other chest pain: Secondary | ICD-10-CM | POA: Diagnosis not present

## 2022-08-10 LAB — CBC
HCT: 38.1 % (ref 36.0–46.0)
Hemoglobin: 12 g/dL (ref 12.0–15.0)
MCH: 27.9 pg (ref 26.0–34.0)
MCHC: 31.5 g/dL (ref 30.0–36.0)
MCV: 88.6 fL (ref 80.0–100.0)
Platelets: 313 10*3/uL (ref 150–400)
RBC: 4.3 MIL/uL (ref 3.87–5.11)
RDW: 14.6 % (ref 11.5–15.5)
WBC: 15.4 10*3/uL — ABNORMAL HIGH (ref 4.0–10.5)
nRBC: 0 % (ref 0.0–0.2)

## 2022-08-10 LAB — BASIC METABOLIC PANEL
Anion gap: 11 (ref 5–15)
BUN: 15 mg/dL (ref 8–23)
CO2: 26 mmol/L (ref 22–32)
Calcium: 10.1 mg/dL (ref 8.9–10.3)
Chloride: 102 mmol/L (ref 98–111)
Creatinine, Ser: 0.9 mg/dL (ref 0.44–1.00)
GFR, Estimated: >60 mL/min (ref >60)
Glucose, Bld: 101 mg/dL — ABNORMAL HIGH (ref 70–99)
Potassium: 3.8 mmol/L (ref 3.5–5.1)
Sodium: 139 mmol/L (ref 135–145)

## 2022-08-10 LAB — TROPONIN I (HIGH SENSITIVITY)
Troponin I (High Sensitivity): 6 ng/L (ref <18)
Troponin I (High Sensitivity): 5 ng/L (ref <18)

## 2022-08-10 NOTE — Telephone Encounter (Signed)
Called pt in regards to CP.  Pt reports started having palpitations last week. Then turned into CP Fri, Sat, and Sunday.  Describes as intermittent sharp CP that changed to more of an ache that went down left arm.  Also had increased fatigue. Advised pt with these symptoms an ED visit is warranted.  Pt reports is no longer having CP and does not want to go to the ED at this time.  Advised pt if CP returns to immediately call 911 for evaluation.  Pt expresses understanding.  When CP occurred pt says was relaxing not doing anything strenuous.   Took diltiazem 30 mg PO yesterday late afternoon/ early evening. BP 124/57-66. Denied shoulder pain, jaw pain, diaphoresis, did report having chills.   Today BP 134/69-70 has not missed any doses of diltiazem 180 mg PO QD.   Advised pt will send to MD to address.  Pt scheduled to see PA tomorrow at 8:50 am. Again advised pt if CP returns immediately call 911.  Pt expresses understanding.

## 2022-08-10 NOTE — ED Triage Notes (Signed)
Patient reports chest pain and palpitations that have been intermittent for last few days.  Reports one time it radiated down left arm but is also in axilla. +SOB -n/v - sweating

## 2022-08-10 NOTE — Telephone Encounter (Signed)
Pt c/o of Chest Pain: STAT if CP now or developed within 24 hours  1. Are you having CP right now? No   2. Are you experiencing any other symptoms (ex. SOB, nausea, vomiting, sweating)? Some SOB, but she does has some lung concerns so its not unusual for her.   3. How long have you been experiencing CP? Several minutes   4. Is your CP continuous or coming and going? Coming and going   5. Have you taken Nitroglycerin? No  Pt called stating she has some flutters last week. She states she experienced some chest pain yesterday and Saturday. She states the pain was in her chest on the left side and under her arm. Pt made a gen card f/u tomorrow at 8:50 ?

## 2022-08-10 NOTE — ED Provider Triage Note (Signed)
Emergency Medicine Provider Triage Evaluation Note  Michele Jones , a 71 y.o. female  was evaluated in triage.  Pt complains of chest pain that has been intermittent over the last 3 days.  She states symptoms started initially with palpitations and then seem to improve.  The next day, she had left-sided chest pain described as sharp that was intermittent.  She states she now has a tightness extending across her entire chest with pain radiating into the left axilla.  She also has very mild shortness of breath, although she states she does have a "lung condition" and is unsure if it is secondary to that.  She called her cardiologist, Dr. Radford Pax, who advised her to go to the emergency department given duration of symptoms.  Patient sees cardiology for obstructive sleep apnea and history of palpitations.  Review of Systems  Positive:  Negative:   Physical Exam  BP (!) 148/64   Pulse 73   Temp 98.7 F (37.1 C) (Oral)   Resp 14   Ht '5\' 7"'$  (1.702 m)   Wt 49 kg   SpO2 99%   BMI 16.92 kg/m  Gen:   Awake, no distress   Resp:  Normal effort  MSK:   Moves extremities without difficulty  Other:    Medical Decision Making  Medically screening exam initiated at 2:28 PM.  Appropriate orders placed.  Michele Jones was informed that the remainder of the evaluation will be completed by another provider, this initial triage assessment does not replace that evaluation, and the importance of remaining in the ED until their evaluation is complete.     Tonye Pearson, [place] 08/10/22 1430

## 2022-08-10 NOTE — Progress Notes (Deleted)
Cardiology Office Note:    Date:  08/10/2022   ID:  Michele Jones, DOB 1951-07-26, MRN [id number]  PCP:  Vernie Shanks, MD (Inactive)  [facility] Cardiologist:  Fransico Him, MD  [facility] Electrophysiologist:  None   Chief Complaint: chest pain  History of Present Illness:    Michele Jones is a 71 y.o. female with a hx of symptomatic PVC's and PAC's, nonsustained atrial tachycardia, mild OSA on CPAP and diastolic dysfunction  seen for follow up and CP/SOB.   Hx of alpha trypsin 1 def and COPD with bronchiectasis. She has chronic SOB and atypical chest pain with occasional exacerbation.   Coronary CT 01/2018: Calcium score of 0 2D echo 12/2020 showed normal LVF and Diastolic dysfunction  Past Medical History:  Diagnosis Date   Allergy    Aortic atherosclerosis (HCC)    noted in abdominal aorta by CT 09/2020   Bronchiectasis (HCC)    COPD (chronic obstructive pulmonary disease) (HCC)    alpha 1 antitrypsin deficiency   Fibromyalgia    Malaria    OSA (obstructive sleep apnea)    mild with AHI 5.8/hr now on CPAP at 10cm H2o   Osteoporosis    PAC (premature atrial contraction)    Paroxysmal atrial tachycardia (HCC)    PVC's (premature ventricular contractions)    RBBB    no ischemia on nuclear stree test    Past Surgical History:  Procedure Laterality Date   APPENDECTOMY     TEMPOROMANDIBULAR JOINT SURGERY      Current Medications: No outpatient medications have been marked as taking for the 08/11/22 encounter (Appointment) with Leanor Kail, PA.     Allergies:   Amoxicillin, Molds & smuts, Septra [sulfamethoxazole-trimethoprim], Cefdinir, Clindamycin, Augmentin [amoxicillin-pot clavulanate], Latex, Levofloxacin, Penicillins, and Tetracycline   Social History   Socioeconomic History   Marital status: Married    Spouse name: Not on file   Number of children: Not on file   Years of education: Not on file   Highest education level: Not on  file  Occupational History   Not on file  Tobacco Use   Smoking status: Never   Smokeless tobacco: Never  Vaping Use   Vaping Use: Never used  Substance and Sexual Activity   Alcohol use: No    Alcohol/week: 0.0 standard drinks of alcohol   Drug use: No   Sexual activity: Not on file  Other Topics Concern   Not on file  Social History Narrative   Not on file   Social Determinants of Health   Financial Resource Strain: Not on file  Food Insecurity: Not on file  Transportation Needs: Not on file  Physical Activity: Not on file  Stress: Not on file  Social Connections: Not on file     Family History: The patient's family history includes Cancer in her father and maternal grandmother; Diabetes in her paternal grandmother; Hypertension in her brother; Stroke in her mother.    ROS:   Please see the history of present illness.    All other systems reviewed and are negative.   EKGs/Labs/Other Studies Reviewed:    The following studies were reviewed today:  Echo 12/2020 1. Left ventricular ejection fraction, by estimation, is 65 to 70%. The  left ventricle has normal function. The left ventricle has no regional  wall motion abnormalities. Left ventricular diastolic parameters are  indeterminate. Elevated left ventricular  end-diastolic pressure. The average left ventricular global longitudinal  strain is -28.0 %. The global longitudinal strain  is normal.   2. Right ventricular systolic function is normal. The right ventricular  size is normal. There is normal pulmonary artery systolic pressure. The  estimated right ventricular systolic pressure is 35.3 mmHg.   3. The mitral valve is normal in structure. Trivial mitral valve  regurgitation. No evidence of mitral stenosis.   4. The aortic valve is tricuspid. Aortic valve regurgitation is not  visualized. Mild aortic valve sclerosis is present, with no evidence of  aortic valve stenosis.   5. The inferior vena cava is normal  in size with greater than 50%  respiratory variability, suggesting right atrial pressure of 3 mmHg.   Coronary CT 01/2018 IMPRESSION: 1. Coronary artery calcium score 0 Agatston units, suggesting low risk for future cardiac events.   2.  No plaque or stenosis noted in the coronary arteries.  EKG:  EKG is *** ordered today.  The ekg ordered today demonstrates ***  Recent Labs: No results found for requested labs within last 365 days.  Recent Lipid Panel No results found for: "CHOL", "TRIG", "HDL", "CHOLHDL", "VLDL", "LDLCALC", "LDLDIRECT"   Risk Assessment/Calculations:   {Does this patient have ATRIAL FIBRILLATION?:[phone]}   Physical Exam:    VS:  There were no vitals taken for this visit.    Wt Readings from Last 3 Encounters:  08/05/21 108 lb (49 kg)  12/18/20 110 lb 9.6 oz (50.2 kg)  06/24/20 111 lb 9.6 oz (50.6 kg)     GEN: *** Well nourished, well developed in no acute distress HEENT: Normal NECK: No JVD; No carotid bruits LYMPHATICS: No lymphadenopathy CARDIAC: ***RRR, no murmurs, rubs, gallops RESPIRATORY:  Clear to auscultation without rales, wheezing or rhonchi  ABDOMEN: Soft, non-tender, non-distended MUSCULOSKELETAL:  No edema; No deformity  SKIN: Warm and dry NEUROLOGIC:  Alert and oriented x 3 PSYCHIATRIC:  Normal affect   ASSESSMENT AND PLAN:    ***  2. ***  Medication Adjustments/Labs and Tests Ordered: Current medicines are reviewed at length with the patient today.  Concerns regarding medicines are outlined above.  No orders of the defined types were placed in this encounter.  No orders of the defined types were placed in this encounter.   There are no Patient Instructions on file for this visit.   Jarrett Soho, [place]  08/10/2022 1:26 PM    [facility]

## 2022-08-11 ENCOUNTER — Ambulatory Visit: Payer: Medicare PPO | Admitting: Physician Assistant

## 2022-08-11 ENCOUNTER — Ambulatory Visit: Payer: Medicare HMO | Admitting: Physician Assistant

## 2022-08-11 NOTE — ED Provider Notes (Addendum)
[facility] Emergency Department Provider Note MRN:  [id number]  Arrival date & time: 08/11/22     Chief Complaint   Chest Pain   History of Present Illness   Michele Jones is a 71 y.o. year-old female with a history of alpha-1 antitrypsin deficiency, COPD presenting to the ED with chief complaint of chest pain.  Intermittent chest pain and occasional left shoulder pain over the past 2 or 3 days.  Advised by her cardiology office to come to the emergency department for evaluation.  She denies any dizziness or sweatiness, no nausea vomiting, no shortness of breath.  No leg pain or swelling.  No recent fever or cough.  No pain at this time.  Review of Systems  A thorough review of systems was obtained and all systems are negative except as noted in the HPI and PMH.   Patient's Health History    Past Medical History:  Diagnosis Date   Allergy    Aortic atherosclerosis ([place])    noted in abdominal aorta by CT 09/2020   Bronchiectasis (HCC)    COPD (chronic obstructive pulmonary disease) (HCC)    alpha 1 antitrypsin deficiency   Fibromyalgia    Malaria    OSA (obstructive sleep apnea)    mild with AHI 5.8/hr now on CPAP at 10cm H2o   Osteoporosis    PAC (premature atrial contraction)    Paroxysmal atrial tachycardia (HCC)    PVC's (premature ventricular contractions)    RBBB    no ischemia on nuclear stree test    Past Surgical History:  Procedure Laterality Date   APPENDECTOMY     TEMPOROMANDIBULAR JOINT SURGERY      Family History  Problem Relation Age of Onset   Stroke Mother    Cancer Father    Hypertension Brother    Cancer Maternal Grandmother    Diabetes Paternal Grandmother     Social History   Socioeconomic History   Marital status: Married    Spouse name: Not on file   Number of children: Not on file   Years of education: Not on file   Highest education level: Not on file  Occupational History   Not on file  Tobacco Use    Smoking status: Never   Smokeless tobacco: Never  Vaping Use   Vaping Use: Never used  Substance and Sexual Activity   Alcohol use: No    Alcohol/week: 0.0 standard drinks of alcohol   Drug use: No   Sexual activity: Not on file  Other Topics Concern   Not on file  Social History Narrative   Not on file   Social Determinants of Health   Financial Resource Strain: Not on file  Food Insecurity: Not on file  Transportation Needs: Not on file  Physical Activity: Not on file  Stress: Not on file  Social Connections: Not on file  Intimate Partner Violence: Not on file     Physical Exam   Vitals:   08/10/22 2345 08/11/22 0000  BP: 134/74 133/75  Pulse: 63 63  Resp: 15 16  Temp:    SpO2: 99% 98%    CONSTITUTIONAL: Well-appearing, NAD NEURO/PSYCH:  Alert and oriented x 3, no focal deficits EYES:  eyes equal and reactive ENT/NECK:  no LAD, no JVD CARDIO: Regular rate, well-perfused, normal S1 and S2 PULM:  CTAB no wheezing or rhonchi GI/GU:  non-distended, non-tender MSK/SPINE:  No gross deformities, no edema SKIN:  no rash, atraumatic   *Additional and/or pertinent  findings included in MDM below  Diagnostic and Interventional Summary    EKG Interpretation  Date/Time:  Monday August 10 2022 14:18:11 EDT Ventricular Rate:  72 PR Interval:  160 QRS Duration: 128 QT Interval:  442 QTC Calculation: 483 R Axis:   89 Text Interpretation: Normal sinus rhythm Non-specific intra-ventricular conduction block Abnormal ECG No previous ECGs available Confirmed by Gerlene Fee [phone]) on 08/10/2022 11:23:08 PM       Labs Reviewed  BASIC METABOLIC PANEL - Abnormal; Notable for the following components:      Result Value   Glucose, Bld 101 (*)    All other components within normal limits  CBC - Abnormal; Notable for the following components:   WBC 15.4 (*)    All other components within normal limits  TROPONIN I (HIGH SENSITIVITY)  TROPONIN I (HIGH SENSITIVITY)     DG Chest 2 View  Final Result      Medications - No data to display   Procedures  /  Critical Care Procedures  ED Course and Medical Decision Making  Initial Impression and Ddx Chest pain described as a tightness, does not have any significant cardiac history other than occasional PVCs, palpitations.  History of alpha-1 antitrypsin, COPD.  Does not have any significant wheezing on exam but suspicious that her chest tightness is more pulmonary related.  EKG is without concerning features, labs reassuring without significant blood count or electrolyte disturbance, troponin negative x2.  Chest x-ray without pneumothorax or signs of lobar pneumonia.  No evidence of DVT on exam, no tachycardia, shortness of breath, no hypoxia, highly doubt PE.  Overall reassuring evaluation, patient has cardiology follow-up tomorrow, appropriate for discharge.  Past medical/surgical history that increases complexity of ED encounter: COPD  Interpretation of Diagnostics I personally reviewed the EKG and my interpretation is as follows: Sinus rhythm    Patient Reassessment and Ultimate Disposition/Management     Discharge  Patient management required discussion with the following services or consulting groups:  None  Complexity of Problems Addressed Acute illness or injury that poses threat of life of bodily function  Additional Data Reviewed and Analyzed Further history obtained from: Further history from spouse/family member  Additional Factors Impacting ED Encounter Risk None  [name]. [place]onia Small, [place] [email or web address]  Final Clinical Impressions(s) / ED Diagnoses     ICD-10-CM   1. Chest pain, unspecified type  R07.9       ED Discharge Orders     None        Discharge Instructions Discussed with and Provided to Patient:   Discharge Instructions      You were evaluated in the Emergency Department and after careful  evaluation, we did not find any emergent condition requiring admission or further testing in the hospital.  Your exam/testing today was overall reassuring.  Recommend keeping your cardiology appointment tomorrow with Dr. Radford Pax to discuss your symptoms.  Please return to the Emergency Department if you experience any worsening of your condition.  Thank you for allowing [place] to be a part of your care.       Maudie Flakes, MD 08/11/22 8675    Maudie Flakes, MD 08/11/22 [phone]

## 2022-08-11 NOTE — Discharge Instructions (Signed)
You were evaluated in the Emergency Department and after careful evaluation, we did not find any emergent condition requiring admission or further testing in the hospital.  Your exam/testing today was overall reassuring.  Recommend keeping your cardiology appointment tomorrow with Dr. Radford Pax to discuss your symptoms.  Please return to the Emergency Department if you experience any worsening of your condition.  Thank you for allowing [place] to be a part of your care.

## 2022-08-12 ENCOUNTER — Ambulatory Visit: Payer: Medicare PPO | Attending: Physician Assistant | Admitting: Physician Assistant

## 2022-08-12 ENCOUNTER — Encounter: Payer: Self-pay | Admitting: *Deleted

## 2022-08-12 ENCOUNTER — Encounter: Payer: Self-pay | Admitting: Physician Assistant

## 2022-08-12 VITALS — BP 116/52 | HR 74 | Ht 67.0 in | Wt 106.6 lb

## 2022-08-12 DIAGNOSIS — I700 Atherosclerosis of aorta: Secondary | ICD-10-CM

## 2022-08-12 DIAGNOSIS — I471 Supraventricular tachycardia: Secondary | ICD-10-CM

## 2022-08-12 DIAGNOSIS — M3501 Sicca syndrome with keratoconjunctivitis: Secondary | ICD-10-CM

## 2022-08-12 DIAGNOSIS — G4733 Obstructive sleep apnea (adult) (pediatric): Secondary | ICD-10-CM

## 2022-08-12 DIAGNOSIS — R079 Chest pain, unspecified: Secondary | ICD-10-CM | POA: Diagnosis not present

## 2022-08-12 DIAGNOSIS — R072 Precordial pain: Secondary | ICD-10-CM

## 2022-08-12 DIAGNOSIS — E8801 Alpha-1-antitrypsin deficiency: Secondary | ICD-10-CM | POA: Diagnosis not present

## 2022-08-12 NOTE — Progress Notes (Signed)
Cardiology Office Note:    Date:  08/12/2022   ID:  [name], DOB 1951-01-17, MRN [id number]  PCP:  [facility] [place] Providers Cardiologist:  Fransico Him, MD     Referring MD: [facility]   Chief Complaint:  Hospitalization Follow-up (ED visit for chest pain )    Patient Profile: Paroxysmal atrial tachycardia Managed w Diltiazem  Symptomatic PACs/PVCs RBBB Chest pain  CCTA 3/19: Calcium score 0, no CAD Myoview 1/18: No ischemia Alpha 1 antitrypsin deficiency Chronic Obstructive Pulmonary Disease  Bronchiectasis Sjogren's Disease  Aortic atherosclerosis History of malaria Sleep apnea Leg edema - Rx w Lasix prn    Prior CV Studies: 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 Coronary artery calcium score 0 Agatston units, suggesting low risk for future cardiac events. 2.  No plaque or stenosis noted in the coronary arteries. IMPRESSION: Hyperinflation of the lungs. Bibasilar scarring. No acute extra cardiac abnormality.  CARDIAC TELEMETRY MONITORING-INTERPRETATION ONLY 01/13/2018  Normal sinus rhythm with average heart rate 70bpm. The heart rate ranged from 48 to 117bpm.  Frequent PACs and nonsustained atrial tachycardia up to 19 beats.  GATED SPECT MYO PERF W/EXERCISE STRESS 1D 12/04/2016 Normal resting and stress perfusion. No ischemia or infarction EF 71%   History of Present Illness:   Michele Jones is a 71 y.o. female with the above problem list.  She was last seen by Dr. Radford Pax in 9/22 via telemedicine.  She went to the emergency room 08/10/2022 with chest pain.  Electrocardiogram demonstrated sinus rhythm, HR 72, normal axis, incomplete right bundle branch block, nonspecific ST-T wave changes, QTc  483 (personally reviewed and interpreted).  Labs in the emergency room: hsTrop 6 >>5; K+ 3.8, creatinine 0.9, hemoglobin 12, platelet count 313, chest x-ray: No acute changes.  She returns for follow-up. She is here alone. She has chronic sinusitis. Her symptoms of a flare include epistaxis and fatigue as well as shortness of breath. She had recurrent symptoms several days ago and then developed chest pain. She had substernal tightness on the 1st day. The next day she had more L sided symptoms and radiation down her L arm. She did not have diaphoresis or nausea or radiation to her back. Her symptoms have gradually gotten better. Her sinusitis symptoms are also improving. She has not had orthopnea, leg edema, syncope.     Past Medical History:  Diagnosis Date   Allergy    Aortic atherosclerosis ([place])    noted in abdominal aorta by CT 09/2020   Bronchiectasis (HCC)    COPD (chronic obstructive pulmonary disease) (HCC)    alpha 1 antitrypsin deficiency   Fibromyalgia    Malaria    OSA (obstructive sleep apnea)    mild with AHI 5.8/hr now on CPAP at 10cm H2o   Osteoporosis    PAC (premature atrial contraction)    Paroxysmal atrial tachycardia (HCC)    PVC's (premature ventricular contractions)    RBBB    no ischemia on nuclear stree test   Current Medications: Current Meds  Medication Sig   ANORO ELLIPTA 62.5-25 MCG/INH AEPB Inhale 1 Dose into the lungs daily.   azithromycin (ZITHROMAX) 500 MG tablet Take 1 tablet by mouth 3 (three) times a week.   b complex vitamins tablet  Take 1 tablet by mouth daily.   Boron 3 MG CAPS Take by mouth daily.   Calcium-Vitamin A-Vitamin D (LIQUID CALCIUM PO) Take 5 mLs by mouth daily.   ciprofloxacin (CIPRO) 500 MG tablet Take 500 mg by mouth as needed.   clobetasol (TEMOVATE) 0.05 % external solution Apply 1 Application topically 2 (two) times daily.   COVID-19 mRNA vaccine, Moderna, 100 MCG/0.5ML injection Inject into the muscle.   diltiazem (CARDIZEM  CD) 180 MG 24 hr capsule TAKE 1 CAPSULE BY MOUTH EVERY DAY   diltiazem (CARDIZEM) 30 MG tablet TAKE 1 TABLET (30 MG TOTAL) BY MOUTH DAILY AS NEEDED. FOR PALPITATIONS   glucosamine-chondroitin 500-400 MG tablet Take 1 tablet by mouth daily.    Grape Seed 100 MG CAPS Take by mouth 2 (two) times daily.   Multiple Vitamins-Minerals (MULTIVITAMIN WITH MINERALS) tablet Take 1 tablet by mouth daily.   Olive Leaf Extract 150 MG CAPS Take by mouth daily.   rosuvastatin (CRESTOR) 5 MG tablet Take 5 mg by mouth 2 (two) times a week.   zolpidem (AMBIEN) 10 MG tablet Take 10 mg by mouth at bedtime as needed for sleep.    Allergies:   Amoxicillin, Molds & smuts, Septra [sulfamethoxazole-trimethoprim], Cefdinir, Clindamycin, Clindamycin hcl, Augmentin [amoxicillin-pot clavulanate], Latex, Levofloxacin, Penicillins, and Tetracycline   Social History   Tobacco Use   Smoking status: Never   Smokeless tobacco: Never  Vaping Use   Vaping Use: Never used  Substance Use Topics   Alcohol use: No    Alcohol/week: 0.0 standard drinks of alcohol   Drug use: No    Family Hx: The patient's family history includes Cancer in her father and maternal grandmother; Diabetes in her paternal grandmother; Hypertension in her brother; Stroke in her mother.  Review of Systems  Gastrointestinal:  Negative for diarrhea, hematochezia and vomiting.  Genitourinary:  Negative for hematuria.     EKGs/Labs/Other Test Reviewed:    EKG:  EKG is  not ordered today.  The ekg ordered today demonstrates n/a  Recent Labs: 08/10/2022: BUN 15; Creatinine, Ser 0.90; Hemoglobin 12.0; Platelets 313; Potassium 3.8; Sodium 139   Recent Lipid Panel No results for input(s): "CHOL", "TRIG", "HDL", "VLDL", "LDLCALC", "LDLDIRECT" in the last 8760 hours.   Risk Assessment/Calculations/Metrics:              Physical Exam:    VS:  BP (!) 116/52   Pulse 74   Ht '5\' 7"'$  (1.702 m)   Wt 106 lb 9.6 oz (48.4 kg)   SpO2 98%   BMI 16.70 kg/m      Wt Readings from Last 3 Encounters:  08/12/22 106 lb 9.6 oz (48.4 kg)  08/10/22 108 lb (49 kg)  08/05/21 108 lb (49 kg)    Constitutional:      Appearance: Healthy appearance. Not in distress.  Neck:     Vascular: JVD normal.  Pulmonary:     Effort: Pulmonary effort is normal.     Breath sounds: Wheezing (very faint exp upper lobes) present. No rales.  Cardiovascular:     Normal rate. Regular rhythm. Normal S1. Normal S2.      Murmurs: There is no murmur.  Edema:    Peripheral edema absent.  Abdominal:     Palpations: Abdomen is soft.  Skin:    General: Skin is warm and dry.  Neurological:     Mental Status: Alert and oriented to person, place and time.          ASSESSMENT &  PLAN:   Precordial chest pain She has had substernal, L sided and L arm pain for the last several days. It has gradually gotten better. Her workup in the ED on 9/11 was unremarkable. She had neg hs-Trops and her EKG did not have acute changes. She had a normal CCTA in 2019. Her symptoms have atypical > typical features of angina and may be due to acid reflux which is worsened by Sjogrens or radicular symptoms from her neck. However, as she did have associated L arm symptoms, I have recommended that she undergo stress testing to rule out the possibility of ischemia. Arrange Exercise Myoview  F/u 3 mos or sooner if Myoview is abnormal.   Aortic atherosclerosis (HCC) Continue statin Rx with Crestor 5 mg twice a week.   Paroxysmal atrial tachycardia (HCC) Palpitations seem to be overall controlled on Diltiazem 180 mg once daily and Diltiazem 30 mg prn.   Alpha-1-antitrypsin deficiency [facility]) She is followed by pulmonology.   OSA (obstructive sleep apnea) She is tolerating CPAP. I will make sure she has f/u with Dr. Radford Pax in 3 mos.   Sjogren syndrome with keratoconjunctivitis ([place]) She is followed by Rheumatology.         Shared Decision Making/Informed Consent The risks [chest pain, shortness of  breath, cardiac arrhythmias, dizziness, blood pressure fluctuations, myocardial infarction, stroke/transient ischemic attack, nausea, vomiting, allergic reaction, radiation exposure, metallic taste sensation and life-threatening complications (estimated to be 1 in 10,000)], benefits (risk stratification, diagnosing coronary artery disease, treatment guidance) and alternatives of a nuclear stress test were discussed in detail with Ms. Duffey and she agrees to proceed.   Dispo:  Return in about 3 months (around 11/11/2022) for Routine Follow Up w/ Dr. Radford Pax.    Medication Adjustments/Labs and Tests Ordered: Current medicines are reviewed at length with the patient today.  Concerns regarding medicines are outlined above.  Tests Ordered: Orders Placed This Encounter  Procedures   Cardiac Stress Test: Informed Consent Details: Physician/Practitioner Attestation; Transcribe to consent form and obtain patient signature   MYOCARDIAL PERFUSION IMAGING   Medication Changes: No orders of the defined types were placed in this encounter.  Signed, Richardson Dopp, PA-C  08/12/2022 3:43 PM    [address], [place], [place]  [address] Phone: [phone]; Fax: [phone]

## 2022-08-12 NOTE — Assessment & Plan Note (Signed)
She is followed by pulmonology.

## 2022-08-12 NOTE — Assessment & Plan Note (Signed)
Continue statin Rx with Crestor 5 mg twice a week.

## 2022-08-12 NOTE — Assessment & Plan Note (Signed)
She has had substernal, L sided and L arm pain for the last several days. It has gradually gotten better. Her workup in the ED on 9/11 was unremarkable. She had neg hs-Trops and her EKG did not have acute changes. She had a normal CCTA in 2019. Her symptoms have atypical > typical features of angina and may be due to acid reflux which is worsened by Sjogrens or radicular symptoms from her neck. However, as she did have associated L arm symptoms, I have recommended that she undergo stress testing to rule out the possibility of ischemia. Arrange Exercise Myoview  F/u 3 mos or sooner if Myoview is abnormal.

## 2022-08-12 NOTE — Assessment & Plan Note (Signed)
Palpitations seem to be overall controlled on Diltiazem 180 mg once daily and Diltiazem 30 mg prn.

## 2022-08-12 NOTE — Assessment & Plan Note (Signed)
She is tolerating CPAP. I will make sure she has f/u with Dr. Radford Pax in 3 mos.

## 2022-08-12 NOTE — Patient Instructions (Signed)
Medication Instructions:  Your physician recommends that you continue on your current medications as directed. Please refer to the Current Medication list given to you today.  *If you need a refill on your cardiac medications before your next appointment, please call your pharmacy*   Lab Work: None ordered  If you have labs (blood work) drawn today and your tests are completely normal, you will receive your results only by: [employer] (if you have MyChart) OR A paper copy in the mail If you have any lab test that is abnormal or we need to change your treatment, we will call you to review the results.   Testing/Procedures: Your physician has requested that you have en exercise stress myoview. For further information please visit [email or web address]. Please follow instruction sheet, AS GIVEN   Follow-Up: At [facility], you and your health needs are our priority.  As part of our continuing mission to provide you with exceptional heart care, we have created designated Provider Care Teams.  These Care Teams include your primary Cardiologist (physician) and Advanced Practice Providers (APPs -  Physician Assistants and Nurse Practitioners) who all work together to provide you with the care you need, when you need it.  We recommend signing up for the patient portal called "MyChart".  Sign up information is provided on this After Visit Summary.  MyChart is used to connect with patients for Virtual Visits (Telemedicine).  Patients are able to view lab/test results, encounter notes, upcoming appointments, etc.  Non-urgent messages can be sent to your provider as well.   To learn more about what you can do with MyChart, go to [email or web address].    Your next appointment:   3 month(s)  The format for your next appointment:   In Person  Provider:   Fransico Him, MD  FOR SLEEP FOLLOW-UP    Other Instructions   Important Information About Sugar

## 2022-08-12 NOTE — Assessment & Plan Note (Signed)
She is followed by Rheumatology.

## 2022-08-13 ENCOUNTER — Encounter (HOSPITAL_COMMUNITY): Payer: Self-pay | Admitting: *Deleted

## 2022-08-13 ENCOUNTER — Telehealth (HOSPITAL_COMMUNITY): Payer: Self-pay | Admitting: *Deleted

## 2022-08-13 NOTE — Telephone Encounter (Signed)
Instructions sent to my chart per patient request.  Michele Jones

## 2022-08-14 ENCOUNTER — Telehealth: Payer: Self-pay | Admitting: *Deleted

## 2022-08-14 NOTE — Telephone Encounter (Signed)
     Patient  visit on 08/11/2022  at [facility]  was for chest pain  Have you been able to follow up with your primary care physician? Saw a cardiologist things looked and she will have a follow up on monday . The patient was  able to obtain any needed medicine or equipment.  Are there diet recommendations that you are having difficulty following?  Patient expresses understanding of discharge instructions and education provided has no other needs at this time.  [facility] [phone] [address] , [place] [address] Email : [email or web address]

## 2022-08-17 ENCOUNTER — Ambulatory Visit (HOSPITAL_COMMUNITY): Payer: Medicare PPO | Attending: Physician Assistant

## 2022-08-17 DIAGNOSIS — R079 Chest pain, unspecified: Secondary | ICD-10-CM | POA: Diagnosis not present

## 2022-08-17 LAB — MYOCARDIAL PERFUSION IMAGING
Angina Index: 0
Duke Treadmill Score: 9
Estimated workload: 11.9
Exercise duration (min): 9 min
Exercise duration (sec): 0 s
LV dias vol: 47 mL (ref 46–106)
LV sys vol: 6 mL
MPHR: 147 {beats}/min
Nuc Stress EF: 87 %
Peak HR: 125 {beats}/min
Percent HR: 85 %
Rest HR: 77 {beats}/min
Rest Nuclear Isotope Dose: 10.6 mCi
SDS: 4
SRS: 0
SSS: 4
ST Depression (mm): 0 mm
Stress Nuclear Isotope Dose: 31.3 mCi
TID: 0.95

## 2022-08-17 MED ORDER — TECHNETIUM TC 99M TETROFOSMIN IV KIT
10.6000 | PACK | Freq: Once | INTRAVENOUS | Status: AC | PRN
  Administered 2022-08-17: 10.6 via INTRAVENOUS

## 2022-08-17 MED ORDER — TECHNETIUM TC 99M TETROFOSMIN IV KIT
31.3000 | PACK | Freq: Once | INTRAVENOUS | Status: AC | PRN
  Administered 2022-08-17: 31.3 via INTRAVENOUS

## 2022-08-18 ENCOUNTER — Encounter: Payer: Self-pay | Admitting: Physician Assistant

## 2022-08-27 ENCOUNTER — Other Ambulatory Visit: Payer: Self-pay | Admitting: Cardiology

## 2022-09-11 ENCOUNTER — Other Ambulatory Visit (HOSPITAL_BASED_OUTPATIENT_CLINIC_OR_DEPARTMENT_OTHER): Payer: Self-pay

## 2022-09-11 DIAGNOSIS — L821 Other seborrheic keratosis: Secondary | ICD-10-CM | POA: Diagnosis not present

## 2022-09-11 DIAGNOSIS — D1801 Hemangioma of skin and subcutaneous tissue: Secondary | ICD-10-CM | POA: Diagnosis not present

## 2022-09-11 DIAGNOSIS — L218 Other seborrheic dermatitis: Secondary | ICD-10-CM | POA: Diagnosis not present

## 2022-09-11 DIAGNOSIS — D692 Other nonthrombocytopenic purpura: Secondary | ICD-10-CM | POA: Diagnosis not present

## 2022-09-11 DIAGNOSIS — D2262 Melanocytic nevi of left upper limb, including shoulder: Secondary | ICD-10-CM | POA: Diagnosis not present

## 2022-09-11 DIAGNOSIS — Z419 Encounter for procedure for purposes other than remedying health state, unspecified: Secondary | ICD-10-CM | POA: Diagnosis not present

## 2022-09-11 DIAGNOSIS — D2271 Melanocytic nevi of right lower limb, including hip: Secondary | ICD-10-CM | POA: Diagnosis not present

## 2022-09-11 DIAGNOSIS — D224 Melanocytic nevi of scalp and neck: Secondary | ICD-10-CM | POA: Diagnosis not present

## 2022-09-11 MED ORDER — COVID-19 MRNA 2023-2024 VACCINE (COMIRNATY) 0.3 ML INJECTION
INTRAMUSCULAR | 0 refills | Status: AC
  Filled 2022-09-11: qty 0.3, 1d supply, fill #0

## 2022-09-15 DIAGNOSIS — H9190 Unspecified hearing loss, unspecified ear: Secondary | ICD-10-CM | POA: Diagnosis not present

## 2022-09-15 DIAGNOSIS — J310 Chronic rhinitis: Secondary | ICD-10-CM | POA: Diagnosis not present

## 2022-09-15 DIAGNOSIS — R040 Epistaxis: Secondary | ICD-10-CM | POA: Diagnosis not present

## 2022-09-15 DIAGNOSIS — E8801 Alpha-1-antitrypsin deficiency: Secondary | ICD-10-CM | POA: Diagnosis not present

## 2022-09-17 DIAGNOSIS — Z23 Encounter for immunization: Secondary | ICD-10-CM | POA: Diagnosis not present

## 2022-09-18 DIAGNOSIS — R5383 Other fatigue: Secondary | ICD-10-CM | POA: Diagnosis not present

## 2022-09-18 DIAGNOSIS — G4730 Sleep apnea, unspecified: Secondary | ICD-10-CM | POA: Diagnosis not present

## 2022-09-18 DIAGNOSIS — N959 Unspecified menopausal and perimenopausal disorder: Secondary | ICD-10-CM | POA: Diagnosis not present

## 2022-09-18 DIAGNOSIS — D509 Iron deficiency anemia, unspecified: Secondary | ICD-10-CM | POA: Diagnosis not present

## 2022-09-18 DIAGNOSIS — E039 Hypothyroidism, unspecified: Secondary | ICD-10-CM | POA: Diagnosis not present

## 2022-09-18 DIAGNOSIS — E782 Mixed hyperlipidemia: Secondary | ICD-10-CM | POA: Diagnosis not present

## 2022-09-18 DIAGNOSIS — R109 Unspecified abdominal pain: Secondary | ICD-10-CM | POA: Diagnosis not present

## 2022-10-07 DIAGNOSIS — E8801 Alpha-1-antitrypsin deficiency: Secondary | ICD-10-CM | POA: Diagnosis not present

## 2022-10-07 DIAGNOSIS — G4733 Obstructive sleep apnea (adult) (pediatric): Secondary | ICD-10-CM | POA: Diagnosis not present

## 2022-10-07 DIAGNOSIS — E785 Hyperlipidemia, unspecified: Secondary | ICD-10-CM | POA: Diagnosis not present

## 2022-10-07 DIAGNOSIS — J479 Bronchiectasis, uncomplicated: Secondary | ICD-10-CM | POA: Diagnosis not present

## 2022-10-07 DIAGNOSIS — M797 Fibromyalgia: Secondary | ICD-10-CM | POA: Diagnosis not present

## 2022-10-07 DIAGNOSIS — M810 Age-related osteoporosis without current pathological fracture: Secondary | ICD-10-CM | POA: Diagnosis not present

## 2022-10-07 DIAGNOSIS — J449 Chronic obstructive pulmonary disease, unspecified: Secondary | ICD-10-CM | POA: Diagnosis not present

## 2022-10-07 DIAGNOSIS — E049 Nontoxic goiter, unspecified: Secondary | ICD-10-CM | POA: Diagnosis not present

## 2022-10-07 DIAGNOSIS — I700 Atherosclerosis of aorta: Secondary | ICD-10-CM | POA: Diagnosis not present

## 2022-12-09 ENCOUNTER — Encounter: Payer: Self-pay | Admitting: Cardiology

## 2022-12-09 ENCOUNTER — Ambulatory Visit: Payer: Medicare PPO | Attending: Cardiology | Admitting: Cardiology

## 2022-12-09 VITALS — BP 112/56 | HR 73 | Ht 67.0 in | Wt 110.0 lb

## 2022-12-09 DIAGNOSIS — I493 Ventricular premature depolarization: Secondary | ICD-10-CM

## 2022-12-09 DIAGNOSIS — R0789 Other chest pain: Secondary | ICD-10-CM | POA: Diagnosis not present

## 2022-12-09 DIAGNOSIS — G4733 Obstructive sleep apnea (adult) (pediatric): Secondary | ICD-10-CM

## 2022-12-09 DIAGNOSIS — I700 Atherosclerosis of aorta: Secondary | ICD-10-CM

## 2022-12-09 DIAGNOSIS — I4719 Other supraventricular tachycardia: Secondary | ICD-10-CM

## 2022-12-09 DIAGNOSIS — R600 Localized edema: Secondary | ICD-10-CM

## 2022-12-09 MED ORDER — SALINE SPRAY 0.65 % NA SOLN: 2.0000 | Freq: Two times a day (BID) | NASAL | 0 refills | Status: DC | PRN

## 2022-12-09 NOTE — Patient Instructions (Signed)
Medication Instructions:  Your physician has recommended you make the following change in your medication:   1) START saline nasal spray 2 sprays into each nostril twice daily as needed for congestion. You can get this over the counter at any pharmacy.  *If you need a refill on your cardiac medications before your next appointment, please call your pharmacy*  Lab Work: NONE  Testing/Procedures: NONE  Follow-Up: At [facility], you and your health needs are our priority.  As part of our continuing mission to provide you with exceptional heart care, we have created designated Provider Care Teams.  These Care Teams include your primary Cardiologist (physician) and Advanced Practice Providers (APPs -  Physician Assistants and Nurse Practitioners) who all work together to provide you with the care you need, when you need it.  Your next appointment:   1 year(s)  The format for your next appointment:   In Person  Provider:   Fransico Him, MD  Important Information About Sugar

## 2022-12-09 NOTE — Progress Notes (Signed)
Date:  12/09/2022   ID:  Michele Jones, DOB May 31, 1951, MRN [id number] The patient was identified using 2 identifiers.  PCP:  [facility] @ [place] Providers Cardiologist:  Fransico Him, MD     Evaluation Performed:  Follow-Up Visit  Chief Complaint:  OSA, PVCs, PAT  History of Present Illness:    Michele Jones is a 72 y.o. female with a hx of symptomatic PVC's and PAC's, nonsustained atrial tachycardia, mild OSA on CPAP and diastolic dysfunction.  She has alpha trypsin 1 def and COPD with bronchiectasis and has chronic SOB and is followed at a clinic in [facility] by Pulmonology.  She has a history of RBBB and nuclear stress test 11/2016 was normal.  2D echo 2017 showed normal LVF .    She is here today for followup and is doing well.  She did have an episode of CP in the fall and had a nuclear stress test done showing no ischemia. She has not had any further CP. She denies any PND, orthopnea, LE edema, dizziness, palpitations (except when she gets a cold or has to take sinus medications for sinus infection) or syncope. She does have some issues with DOE at times related to her bronchietasis.  This is stable. She is compliant with her meds and is tolerating meds with no SE.    She is doing well with her PAP device and thinks that she has gotten used to it.  She tolerates the mask and feels the pressure is adequate.  Since going on PAP she feels rested in the am and has no significant daytime sleepiness.  She denies any significant mouth or nasal dryness or nasal congestion.  She does not think that he snores.    Past Medical History:  Diagnosis Date   Allergy    Aortic atherosclerosis ([place])    noted in abdominal aorta by CT 09/2020   Bronchiectasis ([place])    COPD (chronic obstructive pulmonary disease) (HCC)    alpha 1 antitrypsin deficiency   Fibromyalgia    History of nuclear stress test    Myoview 9/23: EF 87, no ischemia or scar; excellent  exercise capacity (Ex 9"); low risk   Malaria    OSA (obstructive sleep apnea)    mild with AHI 5.8/hr now on CPAP at 10cm H2o   Osteoporosis    PAC (premature atrial contraction)    Paroxysmal atrial tachycardia    PVC's (premature ventricular contractions)    RBBB    no ischemia on nuclear stree test   Past Surgical History:  Procedure Laterality Date   APPENDECTOMY     TEMPOROMANDIBULAR JOINT SURGERY       Current Meds  Medication Sig   azithromycin (ZITHROMAX) 500 MG tablet Take 1 tablet by mouth 3 (three) times a week.   b complex vitamins tablet Take 1 tablet by mouth daily.   Boron 3 MG CAPS Take by mouth daily.   Calcium-Vitamin A-Vitamin D (LIQUID CALCIUM PO) Take 5 mLs by mouth daily.   ciprofloxacin (CIPRO) 500 MG tablet Take 500 mg by mouth as needed.   clobetasol (TEMOVATE) 0.05 % external solution Apply 1 Application topically 2 (two) times daily.   COVID-19 mRNA vaccine 2023-2024 (COMIRNATY) SUSP injection Inject into the muscle.   COVID-19 mRNA vaccine, Moderna, 100 MCG/0.5ML injection Inject into the muscle.   diltiazem (CARDIZEM CD) 180 MG 24 hr capsule TAKE 1 CAPSULE BY MOUTH EVERY DAY   diltiazem (CARDIZEM) 30 MG  tablet TAKE 1 TABLET (30 MG TOTAL) BY MOUTH DAILY AS NEEDED. FOR PALPITATIONS   glucosamine-chondroitin 500-400 MG tablet Take 1 tablet by mouth daily.    Grape Seed 100 MG CAPS Take by mouth 2 (two) times daily.   Multiple Vitamins-Minerals (MULTIVITAMIN WITH MINERALS) tablet Take 1 tablet by mouth daily.   Olive Leaf Extract 150 MG CAPS Take by mouth daily.   rosuvastatin (CRESTOR) 5 MG tablet Take 5 mg by mouth 2 (two) times a week.   STIOLTO RESPIMAT 2.5-2.5 MCG/ACT AERS 2 puffs daily   zolpidem (AMBIEN) 10 MG tablet Take 10 mg by mouth at bedtime as needed for sleep.     Allergies:   Amoxicillin, Molds & smuts, Septra [sulfamethoxazole-trimethoprim], Cefdinir, Clindamycin, Clindamycin hcl, Augmentin [amoxicillin-pot clavulanate], Latex,  Levofloxacin, Penicillins, and Tetracycline   Social History   Tobacco Use   Smoking status: Never   Smokeless tobacco: Never  Vaping Use   Vaping Use: Never used  Substance Use Topics   Alcohol use: No    Alcohol/week: 0.0 standard drinks of alcohol   Drug use: No     Family Hx: The patient's family history includes Cancer in her father and maternal grandmother; Diabetes in her paternal grandmother; Hypertension in her brother; Stroke in her mother.  ROS:   Please see the history of present illness.     All other systems reviewed and are negative.   Prior CV studies:   The following studies were reviewed today:  PAP compliance download  Labs/Other Tests and Data Reviewed:    EKG:  No ECG reviewed.  Recent Labs: 08/10/2022: BUN 15; Creatinine, Ser 0.90; Hemoglobin 12.0; Platelets 313; Potassium 3.8; Sodium 139   Recent Lipid Panel No results found for: "CHOL", "TRIG", "HDL", "CHOLHDL", "LDLCALC", "LDLDIRECT"  Wt Readings from Last 3 Encounters:  12/09/22 110 lb (49.9 kg)  08/12/22 106 lb 9.6 oz (48.4 kg)  08/10/22 108 lb (49 kg)     Risk Assessment/Calculations:    Objective:    Vital Signs:  BP (!) 112/56   Pulse 73   Ht '5\' 7"'$  (1.702 m)   Wt 110 lb (49.9 kg)   SpO2 99%   BMI 17.23 kg/m   GEN: Well nourished, well developed in no acute distress HEENT: Normal NECK: No JVD; No carotid bruits LYMPHATICS: No lymphadenopathy CARDIAC:RRR, no murmurs, rubs, gallops RESPIRATORY:  Clear to auscultation without rales, wheezing or rhonchi  ABDOMEN: Soft, non-tender, non-distended MUSCULOSKELETAL:  No edema; No deformity  SKIN: Warm and dry NEUROLOGIC:  Alert and oriented x 3 PSYCHIATRIC:  Normal affect   ASSESSMENT & PLAN:    1.  OSA - The patient is tolerating PAP therapy well without any problems. The PAP download performed by his DME was personally reviewed and interpreted by me today and showed an AHI of 0.3/hr on 10 cm H2O with 100% compliance in using  more than 4 hours nightly.  The patient has been using and benefiting from PAP use and will continue to benefit from therapy.     2.  PAT -She denies any palpitations except when she gets sinus infections and sick -Continue prescription drug management with Cardizem CD 180 mg daily and short acting Cardizem 30 mg as needed for breakthrough palpitations  3.  PVCs -well suppressed on Cardizem   4.  Chronic atypical CP and DOE -likely related to her underlying bronchiectasis -coronary CTA showed no CAD in 2019 -nuclear stress test 07/2022 with no ischemia (done for an episode of CP)  5.  LE edema -2D echo 12/2020 showed normal LVF and Diastolic dysfunction -Continue prescription drug management with Lasix 20 mg as needed with as needed refills -encouraged to follow a 2gm Na diet   6.  Aortic atherosclerosis -noted in abdominal aorta by CT 09/2020 -started on statin per PCP   Medication Adjustments/Labs and Tests Ordered: Current medicines are reviewed at length with the patient today.  Concerns regarding medicines are outlined above.   Tests Ordered: No orders of the defined types were placed in this encounter.    Medication Changes: No orders of the defined types were placed in this encounter.    Follow Up:  In Person in 1 year(s)  Signed, Fransico Him, MD  12/09/2022 11:38 AM    [place]

## 2022-12-13 DIAGNOSIS — G4733 Obstructive sleep apnea (adult) (pediatric): Secondary | ICD-10-CM | POA: Diagnosis not present

## 2022-12-14 DIAGNOSIS — H52223 Regular astigmatism, bilateral: Secondary | ICD-10-CM | POA: Diagnosis not present

## 2022-12-14 DIAGNOSIS — H524 Presbyopia: Secondary | ICD-10-CM | POA: Diagnosis not present

## 2022-12-14 DIAGNOSIS — Z135 Encounter for screening for eye and ear disorders: Secondary | ICD-10-CM | POA: Diagnosis not present

## 2022-12-14 DIAGNOSIS — H259 Unspecified age-related cataract: Secondary | ICD-10-CM | POA: Diagnosis not present

## 2022-12-14 DIAGNOSIS — H5213 Myopia, bilateral: Secondary | ICD-10-CM | POA: Diagnosis not present

## 2022-12-16 DIAGNOSIS — Z681 Body mass index (BMI) 19 or less, adult: Secondary | ICD-10-CM | POA: Diagnosis not present

## 2022-12-16 DIAGNOSIS — Z01419 Encounter for gynecological examination (general) (routine) without abnormal findings: Secondary | ICD-10-CM | POA: Diagnosis not present

## 2022-12-16 DIAGNOSIS — Z1231 Encounter for screening mammogram for malignant neoplasm of breast: Secondary | ICD-10-CM | POA: Diagnosis not present

## 2023-01-09 DIAGNOSIS — G4733 Obstructive sleep apnea (adult) (pediatric): Secondary | ICD-10-CM | POA: Diagnosis not present

## 2023-02-09 DIAGNOSIS — R079 Chest pain, unspecified: Secondary | ICD-10-CM | POA: Diagnosis not present

## 2023-02-09 DIAGNOSIS — N959 Unspecified menopausal and perimenopausal disorder: Secondary | ICD-10-CM | POA: Diagnosis not present

## 2023-02-09 DIAGNOSIS — R5383 Other fatigue: Secondary | ICD-10-CM | POA: Diagnosis not present

## 2023-02-09 DIAGNOSIS — R7989 Other specified abnormal findings of blood chemistry: Secondary | ICD-10-CM | POA: Diagnosis not present

## 2023-02-17 DIAGNOSIS — K5901 Slow transit constipation: Secondary | ICD-10-CM | POA: Diagnosis not present

## 2023-02-18 ENCOUNTER — Other Ambulatory Visit: Payer: Self-pay | Admitting: Family Medicine

## 2023-02-18 DIAGNOSIS — E049 Nontoxic goiter, unspecified: Secondary | ICD-10-CM

## 2023-02-24 DIAGNOSIS — H903 Sensorineural hearing loss, bilateral: Secondary | ICD-10-CM | POA: Diagnosis not present

## 2023-03-10 DIAGNOSIS — G4733 Obstructive sleep apnea (adult) (pediatric): Secondary | ICD-10-CM | POA: Diagnosis not present

## 2023-03-13 DIAGNOSIS — G4733 Obstructive sleep apnea (adult) (pediatric): Secondary | ICD-10-CM | POA: Diagnosis not present

## 2023-03-18 DIAGNOSIS — M810 Age-related osteoporosis without current pathological fracture: Secondary | ICD-10-CM | POA: Diagnosis not present

## 2023-04-08 DIAGNOSIS — J479 Bronchiectasis, uncomplicated: Secondary | ICD-10-CM | POA: Diagnosis not present

## 2023-04-08 DIAGNOSIS — E049 Nontoxic goiter, unspecified: Secondary | ICD-10-CM | POA: Diagnosis not present

## 2023-04-08 DIAGNOSIS — I700 Atherosclerosis of aorta: Secondary | ICD-10-CM | POA: Diagnosis not present

## 2023-04-08 DIAGNOSIS — M797 Fibromyalgia: Secondary | ICD-10-CM | POA: Diagnosis not present

## 2023-04-08 DIAGNOSIS — J449 Chronic obstructive pulmonary disease, unspecified: Secondary | ICD-10-CM | POA: Diagnosis not present

## 2023-04-08 DIAGNOSIS — E785 Hyperlipidemia, unspecified: Secondary | ICD-10-CM | POA: Diagnosis not present

## 2023-04-08 DIAGNOSIS — K5901 Slow transit constipation: Secondary | ICD-10-CM | POA: Diagnosis not present

## 2023-04-08 DIAGNOSIS — M810 Age-related osteoporosis without current pathological fracture: Secondary | ICD-10-CM | POA: Diagnosis not present

## 2023-04-08 DIAGNOSIS — E8801 Alpha-1-antitrypsin deficiency: Secondary | ICD-10-CM | POA: Diagnosis not present

## 2023-04-09 ENCOUNTER — Other Ambulatory Visit: Payer: Medicare PPO

## 2023-05-05 ENCOUNTER — Ambulatory Visit
Admission: RE | Admit: 2023-05-05 | Discharge: 2023-05-05 | Disposition: A | Discharge status: Home / Self Care | Payer: Medicare PPO | Source: Ambulatory Visit | Attending: Family Medicine | Admitting: Family Medicine

## 2023-05-05 DIAGNOSIS — E049 Nontoxic goiter, unspecified: Secondary | ICD-10-CM

## 2023-05-07 DIAGNOSIS — R5383 Other fatigue: Secondary | ICD-10-CM | POA: Diagnosis not present

## 2023-05-07 DIAGNOSIS — K5901 Slow transit constipation: Secondary | ICD-10-CM | POA: Diagnosis not present

## 2023-05-07 DIAGNOSIS — R109 Unspecified abdominal pain: Secondary | ICD-10-CM | POA: Diagnosis not present

## 2023-05-07 DIAGNOSIS — R7989 Other specified abnormal findings of blood chemistry: Secondary | ICD-10-CM | POA: Diagnosis not present

## 2023-05-07 DIAGNOSIS — I4891 Unspecified atrial fibrillation: Secondary | ICD-10-CM | POA: Diagnosis not present

## 2023-05-07 DIAGNOSIS — M26629 Arthralgia of temporomandibular joint, unspecified side: Secondary | ICD-10-CM | POA: Diagnosis not present

## 2023-05-07 DIAGNOSIS — D649 Anemia, unspecified: Secondary | ICD-10-CM | POA: Diagnosis not present

## 2023-05-07 DIAGNOSIS — R079 Chest pain, unspecified: Secondary | ICD-10-CM | POA: Diagnosis not present

## 2023-05-07 DIAGNOSIS — E049 Nontoxic goiter, unspecified: Secondary | ICD-10-CM | POA: Diagnosis not present

## 2023-06-11 DIAGNOSIS — G4733 Obstructive sleep apnea (adult) (pediatric): Secondary | ICD-10-CM | POA: Diagnosis not present

## 2023-06-23 DIAGNOSIS — I7300 Raynaud's syndrome without gangrene: Secondary | ICD-10-CM | POA: Diagnosis not present

## 2023-06-23 DIAGNOSIS — M545 Low back pain, unspecified: Secondary | ICD-10-CM | POA: Diagnosis not present

## 2023-06-23 DIAGNOSIS — M3501 Sicca syndrome with keratoconjunctivitis: Secondary | ICD-10-CM | POA: Diagnosis not present

## 2023-06-23 DIAGNOSIS — M797 Fibromyalgia: Secondary | ICD-10-CM | POA: Diagnosis not present

## 2023-06-23 DIAGNOSIS — M25561 Pain in right knee: Secondary | ICD-10-CM | POA: Diagnosis not present

## 2023-06-23 DIAGNOSIS — M1991 Primary osteoarthritis, unspecified site: Secondary | ICD-10-CM | POA: Diagnosis not present

## 2023-06-23 DIAGNOSIS — Z681 Body mass index (BMI) 19 or less, adult: Secondary | ICD-10-CM | POA: Diagnosis not present

## 2023-07-14 ENCOUNTER — Telehealth: Payer: Self-pay | Admitting: Cardiology

## 2023-07-14 NOTE — Telephone Encounter (Signed)
Called patient to see if swallowing issues need to be assessed in ED or by PCP prior to appt on 07/19/23.   Patient reports that she has Sjogrens's syndrome and she thinks this may be contributing to difficulty swallowing occasionally. She denies choking or difficulty breathing while eating, she says sometimes she just has to work to "get it down". She states she also has recent lab work showing borderline anemia and she is concerned these issues are affecting her heart because she has noticed increased palpitations and poor sleep causing fatigue/malaise recently. She does take her PRN cardizem 30 mg with relief when she has palpitations. Patient w/ appt 07/19/23 to see Dr. Mayford Knife.

## 2023-07-14 NOTE — Telephone Encounter (Signed)
Patient called to schedule an appointment with Dr. Mayford Knife, I scheduled her for 08/19. Patient mentioned feeling off and having a hard time swallowing. Patient stated she is anemic and this could be the cause, but she is concerned it is affecting her heart.

## 2023-07-19 ENCOUNTER — Encounter: Payer: Self-pay | Admitting: Cardiology

## 2023-07-19 ENCOUNTER — Other Ambulatory Visit: Payer: Self-pay

## 2023-07-19 ENCOUNTER — Ambulatory Visit: Payer: Medicare PPO | Attending: Cardiology | Admitting: Cardiology

## 2023-07-19 ENCOUNTER — Ambulatory Visit (INDEPENDENT_AMBULATORY_CARE_PROVIDER_SITE_OTHER): Payer: Medicare PPO

## 2023-07-19 VITALS — BP 140/80 | HR 68 | Ht 67.0 in | Wt 107.0 lb

## 2023-07-19 DIAGNOSIS — I4719 Other supraventricular tachycardia: Secondary | ICD-10-CM

## 2023-07-19 DIAGNOSIS — R002 Palpitations: Secondary | ICD-10-CM | POA: Diagnosis not present

## 2023-07-19 DIAGNOSIS — G4733 Obstructive sleep apnea (adult) (pediatric): Secondary | ICD-10-CM | POA: Diagnosis not present

## 2023-07-19 DIAGNOSIS — I493 Ventricular premature depolarization: Secondary | ICD-10-CM | POA: Diagnosis not present

## 2023-07-19 DIAGNOSIS — R0789 Other chest pain: Secondary | ICD-10-CM

## 2023-07-19 DIAGNOSIS — Z79899 Other long term (current) drug therapy: Secondary | ICD-10-CM | POA: Diagnosis not present

## 2023-07-19 DIAGNOSIS — R600 Localized edema: Secondary | ICD-10-CM | POA: Diagnosis not present

## 2023-07-19 DIAGNOSIS — I700 Atherosclerosis of aorta: Secondary | ICD-10-CM

## 2023-07-19 NOTE — Addendum Note (Signed)
Addended by: Luellen Pucker on: 07/19/2023 08:53 AM   Modules accepted: Orders

## 2023-07-19 NOTE — Patient Instructions (Signed)
Medication Instructions:  Your physician recommends that you continue on your current medications as directed. Please refer to the Current Medication list given to you today.  *If you need a refill on your cardiac medications before your next appointment, please call your pharmacy*   Lab Work: Please complete a TSH, BMET, Mag in our lab today before you leave.  If you have labs (blood work) drawn today and your tests are completely normal, you will receive your results only by: MyChart Message (if you have MyChart) OR A paper copy in the mail If you have any lab test that is abnormal or we need to change your treatment, we will call you to review the results.   Testing/Procedures: Your physician has requested that you have an echocardiogram. Echocardiography is a painless test that uses sound waves to create images of your heart. It provides your doctor with information about the size and shape of your heart and how well your heart's chambers and valves are working. This procedure takes approximately one hour. There are no restrictions for this procedure. Please do NOT wear cologne, perfume, aftershave, or lotions (deodorant is allowed). Please arrive 15 minutes prior to your appointment time.  ZIO XT- Long Term Monitor Instructions  Your physician has requested you wear a ZIO patch monitor for 14 days.  This is a single patch monitor. [employer] supplies one patch monitor per enrollment. Additional stickers are not available. Please do not apply patch if you will be having a Nuclear Stress Test,  Echocardiogram, Cardiac CT, MRI, or Chest Xray during the period you would be wearing the  monitor. The patch cannot be worn during these tests. You cannot remove and re-apply the  ZIO XT patch monitor.  Your ZIO patch monitor will be mailed 3 day USPS to your address on file. It may take 3-5 days  to receive your monitor after you have been enrolled.  Once you have received your monitor, please  review the enclosed instructions. Your monitor  has already been registered assigning a specific monitor serial # to you.  Billing and Patient Assistance Program Information  We have supplied [employer] with any of your insurance information on file for billing purposes. [employer] offers a sliding scale Patient Assistance Program for patients that do not have  insurance, or whose insurance does not completely cover the cost of the ZIO monitor.  You must apply for the Patient Assistance Program to qualify for this discounted rate.  To apply, please call [employer] at [phone], select option 4, select option 2, ask to apply for  Patient Assistance Program. Meredeth Ide will ask your household income, and how many people  are in your household. They will quote your out-of-pocket cost based on that information.  [employer] will also be able to set up a 70-month, interest-free payment plan if needed.  Applying the monitor   Shave hair from upper left chest.  Hold abrader disc by orange tab. Rub abrader in 40 strokes over the upper left chest as  indicated in your monitor instructions.  Clean area with 4 enclosed alcohol pads. Let dry.  Apply patch as indicated in monitor instructions. Patch will be placed under collarbone on left  side of chest with arrow pointing upward.  Rub patch adhesive wings for 2 minutes. Remove white label marked "1". Remove the white  label marked "2". Rub patch adhesive wings for 2 additional minutes.  While looking in a mirror, press and release button in center of patch. A small green  light will  flash 3-4 times. This will be your only indicator that the monitor has been turned on.  Do not shower for the first 24 hours. You may shower after the first 24 hours.  Press the button if you feel a symptom. You will hear a small click. Record Date, Time and  Symptom in the Patient Logbook.  When you are ready to remove the patch, follow instructions on the last 2 pages of Patient   Logbook. Stick patch monitor onto the last page of Patient Logbook.  Place Patient Logbook in the blue and white box. Use locking tab on box and tape box closed  securely. The blue and white box has prepaid postage on it. Please place it in the mailbox as  soon as possible. Your physician should have your test results approximately 7 days after the  monitor has been mailed back to [facility].  Call [facility] Customer Care at [phone] if you have questions regarding  your ZIO XT patch monitor. Call them immediately if you see an orange light blinking on your  monitor.  If your monitor falls off in less than 4 days, contact our Monitor department at [phone].  If your monitor becomes loose or falls off after 4 days call [employer] at [phone] for  suggestions on securing your monitor    Follow-Up: At [facility], you and your health needs are our priority.  As part of our continuing mission to provide you with exceptional heart care, we have created designated Provider Care Teams.  These Care Teams include your primary Cardiologist (physician) and Advanced Practice Providers (APPs -  Physician Assistants and Nurse Practitioners) who all work together to provide you with the care you need, when you need it.  We recommend signing up for the patient portal called "MyChart".  Sign up information is provided on this After Visit Summary.  MyChart is used to connect with patients for Virtual Visits (Telemedicine).  Patients are able to view lab/test results, encounter notes, upcoming appointments, etc.  Non-urgent messages can be sent to your provider as well.   To learn more about what you can do with MyChart, go to [email or web address].    Your next appointment:   1 year(s)  Provider:   Armanda Magic, MD

## 2023-07-19 NOTE — Progress Notes (Addendum)
Date:  07/19/2023   ID:  Michele Jones, DOB 11/12/1951, MRN [id number] The patient was identified using 2 identifiers.  PCP:  [facility] Providers Cardiologist:  Armanda Magic, MD     Evaluation Performed:  Follow-Up Visit  Chief Complaint:  OSA, PVCs, PAT  History of Present Illness:    Michele Jones is a 72 y.o. female with a hx of symptomatic PVC's and PAC's, nonsustained atrial tachycardia, mild OSA on CPAP and diastolic dysfunction.  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  She has a history of RBBB and nuclear stress test 11/2016 was normal.  2D echo 2017 showed normal LVF .    She is here today for followup because she has started having palpitations especially at night. This has been going on for about 10 days and everyday she has some that lasts a few seconds. She has no dizziness or syncope with the palpitations.  She has been feeling tired and in a brain fog and is seeing her primary MD this week. She denies any chest pain or pressure, SOB, DOE (except if she is very tired), PND, orthopnea, LE edema, dizziness or syncope. She is compliant with her meds and is tolerating meds with no SE.    She is doing well with her PAP device and thinks that she has gotten used to it.  She tolerates the mask and feels the pressure is adequate.  Since going on PAP she feels rested in the am and has no significant daytime sleepiness.  She denies any significant mouth or nasal dryness or nasal congestion.  She does not think that he snores.    Past Medical History:  Diagnosis Date   Allergy    Aortic atherosclerosis (HCC)    noted in abdominal aorta by CT 09/2020   Bronchiectasis (HCC)    COPD (chronic obstructive pulmonary disease) (HCC)    alpha 1 antitrypsin deficiency   Fibromyalgia    History of nuclear stress test    Myoview 9/23: EF 87, no  ischemia or scar; excellent exercise capacity (Ex 9"); low risk   Malaria    OSA (obstructive sleep apnea)    mild with AHI 5.8/hr now on CPAP at 10cm H2o   Osteoporosis    PAC (premature atrial contraction)    Paroxysmal atrial tachycardia    PVC's (premature ventricular contractions)    RBBB    no ischemia on nuclear stree test   Past Surgical History:  Procedure Laterality Date   APPENDECTOMY     TEMPOROMANDIBULAR JOINT SURGERY       Current Meds  Medication Sig   azithromycin (ZITHROMAX) 500 MG tablet Take 1 tablet by mouth 3 (three) times a week.   b complex vitamins tablet Take 1 tablet by mouth daily.   Boron 3 MG CAPS Take by mouth daily.   Calcium-Vitamin A-Vitamin D (LIQUID CALCIUM PO) Take 5 mLs by mouth daily.   ciprofloxacin (CIPRO) 500 MG tablet Take 500 mg by mouth as needed.   clobetasol (TEMOVATE) 0.05 % external solution Apply 1 Application topically 2 (two) times daily.   COVID-19 mRNA vaccine 2023-2024 (COMIRNATY) SUSP injection Inject into the muscle.   COVID-19 mRNA vaccine, Moderna, 100 MCG/0.5ML injection Inject into the muscle.   diltiazem (CARDIZEM CD) 180 MG 24 hr capsule TAKE 1 CAPSULE BY MOUTH EVERY DAY   diltiazem (  CARDIZEM) 30 MG tablet TAKE 1 TABLET (30 MG TOTAL) BY MOUTH DAILY AS NEEDED. FOR PALPITATIONS   glucosamine-chondroitin 500-400 MG tablet Take 1 tablet by mouth daily.    Grape Seed 100 MG CAPS Take by mouth 2 (two) times daily.   Multiple Vitamins-Minerals (MULTIVITAMIN WITH MINERALS) tablet Take 1 tablet by mouth daily.   Olive Leaf Extract 150 MG CAPS Take by mouth daily.   rosuvastatin (CRESTOR) 5 MG tablet Take 5 mg by mouth 2 (two) times a week.   sodium chloride (OCEAN) 0.65 % SOLN nasal spray Place 2 sprays into both nostrils 2 (two) times daily as needed for congestion.   STIOLTO RESPIMAT 2.5-2.5 MCG/ACT AERS 2 puffs daily   zolpidem (AMBIEN) 10 MG tablet Take 10 mg by mouth at bedtime as needed for sleep.     Allergies:    Amoxicillin, Molds & smuts, Septra [sulfamethoxazole-trimethoprim], Cefdinir, Clindamycin, Clindamycin hcl, Augmentin [amoxicillin-pot clavulanate], Latex, Levofloxacin, Penicillins, and Tetracycline   Social History   Tobacco Use   Smoking status: Never   Smokeless tobacco: Never  Vaping Use   Vaping status: Never Used  Substance Use Topics   Alcohol use: No    Alcohol/week: 0.0 standard drinks of alcohol   Drug use: No     Family Hx: The patient's family history includes Cancer in her father and maternal grandmother; Diabetes in her paternal grandmother; Hypertension in her brother; Stroke in her mother.  ROS:   Please see the history of present illness.     All other systems reviewed and are negative.   Prior CV studies:   The following studies were reviewed today:  PAP compliance download  Labs/Other Tests and Data Reviewed:    EKG:  NSR with RBBB >>changed from IVCD on last EKG 07/2022  Recent Labs: 08/10/2022: BUN 15; Creatinine, Ser 0.90; Hemoglobin 12.0; Platelets 313; Potassium 3.8; Sodium 139   Recent Lipid Panel No results found for: "CHOL", "TRIG", "HDL", "CHOLHDL", "LDLCALC", "LDLDIRECT"  Wt Readings from Last 3 Encounters:  07/19/23 107 lb (48.5 kg)  12/09/22 110 lb (49.9 kg)  08/12/22 106 lb 9.6 oz (48.4 kg)     Risk Assessment/Calculations:    Objective:    Vital Signs:  BP (!) 140/80 (BP Location: Left Arm, Patient Position: Sitting, Cuff Size: Normal)   Pulse 68   Ht 5\' 7"  (1.702 m)   Wt 107 lb (48.5 kg)   BMI 16.76 kg/m   GEN: Well nourished, well developed in no acute distress HEENT: Normal NECK: No JVD; No carotid bruits LYMPHATICS: No lymphadenopathy CARDIAC:RRR, no murmurs, rubs, gallops RESPIRATORY:  Clear to auscultation without rales, wheezing or rhonchi  ABDOMEN: Soft, non-tender, non-distended MUSCULOSKELETAL:  No edema; No deformity  SKIN: Warm and dry NEUROLOGIC:  Alert and oriented x 3 PSYCHIATRIC:  Normal affect   ASSESSMENT & PLAN:    1.  OSA - The patient is tolerating PAP therapy well without any problems. The PAP download performed by his DME was personally reviewed and interpreted by me today and showed an AHI of 0.2 /hr on 10 cm H2O with 100% compliance in using more than 4 hours nightly.  The patient has been using and benefiting from PAP use and will continue to benefit from therapy.     2.  PAT -She has been having some palpitations for the past 10 days which are new for her -Continue prescription drug medicine with Cardizem CD 120 mg daily with as needed refills -I will get a 2 week  ziopatch to assess for arrhythmias  3.  PVCs -These are well-controlled on calcium channel blocker   4.  Chronic atypical CP and DOE -likely related to her underlying bronchiectasis>>no further chest pain>>she sometimes has a pain in her axilla -coronary CTA showed no CAD in 2019 -nuclear stress test 07/2022 with no ischemia (done for an episode of CP)   5.  LE edema -2D echo 12/2020 showed normal LVF and Diastolic dysfunction -This is well controlled on a low-sodium diet   6.  Aortic atherosclerosis -noted in abdominal aorta by CT 09/2020 -started on statin per PCP  7.  Weakness and Fatigue -likely related to thyroid and anemia -check TSH -check 2D echo to assess LVF -she has followup with her PCP about this this week   Medication Adjustments/Labs and Tests Ordered: Current medicines are reviewed at length with the patient today.  Concerns regarding medicines are outlined above.   Tests Ordered: Orders Placed This Encounter  Procedures   EKG 12-Lead     Medication Changes: No orders of the defined types were placed in this encounter.    Follow Up:  In Person in 1 year(s)  Signed, Armanda Magic, MD  07/19/2023 8:52 AM    [facility]

## 2023-07-19 NOTE — Progress Notes (Unsigned)
Enrolled patient for a 14 day Zio XT  monitor to be mailed to patients home  °

## 2023-07-20 ENCOUNTER — Telehealth: Payer: Self-pay

## 2023-07-20 LAB — BASIC METABOLIC PANEL
BUN/Creatinine Ratio: 14 (ref 12–28)
BUN: 13 mg/dL (ref 8–27)
CO2: 27 mmol/L (ref 20–29)
Calcium: 9.8 mg/dL (ref 8.7–10.3)
Chloride: 102 mmol/L (ref 96–106)
Creatinine, Ser: 0.9 mg/dL (ref 0.57–1.00)
Glucose: 90 mg/dL (ref 70–99)
Potassium: 4.2 mmol/L (ref 3.5–5.2)
Sodium: 142 mmol/L (ref 134–144)
eGFR: 67 mL/min/{1.73_m2} (ref >59)

## 2023-07-20 LAB — MAGNESIUM: Magnesium: 2.1 mg/dL (ref 1.6–2.3)

## 2023-07-20 LAB — TSH: TSH: 4.21 u[IU]/mL (ref 0.450–4.500)

## 2023-07-20 NOTE — Telephone Encounter (Signed)
Reviewed normal labs, patient verbalizes understanding and to continue current medical therapy.

## 2023-07-20 NOTE — Telephone Encounter (Signed)
-----   Message from Armanda Magic sent at 07/20/2023  9:05 AM EDT ----- Please let patient know that labs were normal.  Continue current medical therapy.

## 2023-07-21 DIAGNOSIS — J449 Chronic obstructive pulmonary disease, unspecified: Secondary | ICD-10-CM | POA: Diagnosis not present

## 2023-07-21 DIAGNOSIS — D649 Anemia, unspecified: Secondary | ICD-10-CM | POA: Diagnosis not present

## 2023-07-21 DIAGNOSIS — R002 Palpitations: Secondary | ICD-10-CM

## 2023-07-21 DIAGNOSIS — E785 Hyperlipidemia, unspecified: Secondary | ICD-10-CM | POA: Diagnosis not present

## 2023-07-21 DIAGNOSIS — I7300 Raynaud's syndrome without gangrene: Secondary | ICD-10-CM | POA: Diagnosis not present

## 2023-07-21 DIAGNOSIS — M810 Age-related osteoporosis without current pathological fracture: Secondary | ICD-10-CM | POA: Diagnosis not present

## 2023-07-21 DIAGNOSIS — E049 Nontoxic goiter, unspecified: Secondary | ICD-10-CM | POA: Diagnosis not present

## 2023-07-21 DIAGNOSIS — I700 Atherosclerosis of aorta: Secondary | ICD-10-CM | POA: Diagnosis not present

## 2023-07-21 DIAGNOSIS — M797 Fibromyalgia: Secondary | ICD-10-CM | POA: Diagnosis not present

## 2023-08-03 ENCOUNTER — Other Ambulatory Visit: Payer: Self-pay | Admitting: Cardiology

## 2023-08-04 DIAGNOSIS — E519 Thiamine deficiency, unspecified: Secondary | ICD-10-CM | POA: Diagnosis not present

## 2023-08-04 DIAGNOSIS — R7889 Finding of other specified substances, not normally found in blood: Secondary | ICD-10-CM | POA: Diagnosis not present

## 2023-08-04 DIAGNOSIS — R5383 Other fatigue: Secondary | ICD-10-CM | POA: Diagnosis not present

## 2023-08-04 DIAGNOSIS — D649 Anemia, unspecified: Secondary | ICD-10-CM | POA: Diagnosis not present

## 2023-08-04 DIAGNOSIS — D509 Iron deficiency anemia, unspecified: Secondary | ICD-10-CM | POA: Diagnosis not present

## 2023-08-04 DIAGNOSIS — N959 Unspecified menopausal and perimenopausal disorder: Secondary | ICD-10-CM | POA: Diagnosis not present

## 2023-08-04 DIAGNOSIS — N951 Menopausal and female climacteric states: Secondary | ICD-10-CM | POA: Diagnosis not present

## 2023-08-06 ENCOUNTER — Other Ambulatory Visit: Payer: Self-pay | Admitting: Cardiology

## 2023-08-06 DIAGNOSIS — H9201 Otalgia, right ear: Secondary | ICD-10-CM | POA: Diagnosis not present

## 2023-08-09 DIAGNOSIS — K219 Gastro-esophageal reflux disease without esophagitis: Secondary | ICD-10-CM | POA: Diagnosis not present

## 2023-08-09 DIAGNOSIS — K119 Disease of salivary gland, unspecified: Secondary | ICD-10-CM | POA: Diagnosis not present

## 2023-08-09 DIAGNOSIS — R636 Underweight: Secondary | ICD-10-CM | POA: Diagnosis not present

## 2023-08-09 DIAGNOSIS — R1310 Dysphagia, unspecified: Secondary | ICD-10-CM | POA: Diagnosis not present

## 2023-08-09 DIAGNOSIS — D649 Anemia, unspecified: Secondary | ICD-10-CM | POA: Diagnosis not present

## 2023-08-09 DIAGNOSIS — Z800 Family history of malignant neoplasm of digestive organs: Secondary | ICD-10-CM | POA: Diagnosis not present

## 2023-08-10 DIAGNOSIS — R002 Palpitations: Secondary | ICD-10-CM | POA: Diagnosis not present

## 2023-08-11 ENCOUNTER — Telehealth: Payer: Self-pay

## 2023-08-11 NOTE — Telephone Encounter (Signed)
Call to patient to discuss that heart monitor showed increased episodes of fast heart beat from the top of the heart called atrial tachycardia up to 15 beats in a row that correlated with her documentation of palpitations. Patient states that her palpitations are stable, and if anything, they have improved. She denies any change to her dose and states taht she has been on 180 mg of cardizem for a long time. She denies chest pain or SOB when she gets palpitations and states that she rarely has to use her PRN 30 mg dose of cardizem for relief. Patient responses forwarded to Dr. Mayford Knife.

## 2023-08-11 NOTE — Telephone Encounter (Signed)
-----   Message from Armanda Magic sent at 08/11/2023 11:30 AM EDT ----- Heart monitor showed increased episodes of fast heart beat from the top of the heart called atrial tachycardia up to 15 beats in a row that correlated with her documentation of palpitations. Since her heart monitor her Cardizem appears to have been increased - please find out if palpitations have improved

## 2023-08-12 ENCOUNTER — Ambulatory Visit (HOSPITAL_COMMUNITY): Payer: Medicare PPO | Attending: Cardiology

## 2023-08-12 ENCOUNTER — Telehealth: Payer: Self-pay

## 2023-08-12 DIAGNOSIS — R002 Palpitations: Secondary | ICD-10-CM | POA: Insufficient documentation

## 2023-08-12 DIAGNOSIS — I361 Nonrheumatic tricuspid (valve) insufficiency: Secondary | ICD-10-CM

## 2023-08-12 NOTE — Telephone Encounter (Signed)
Patient requesting Dr. Mayford Knife advise on whether it is ok for her to get Covid booster. Records indicate patient has had 2 covid shots before, confirms at least one was moderna. Forwarded to Dr. Mayford Knife.

## 2023-08-12 NOTE — Telephone Encounter (Signed)
Call to patient to advise that per Dr. Mayford Knife it is ok for her to get Covid booster, patient verbalizes understanding.

## 2023-08-13 LAB — ECHOCARDIOGRAM COMPLETE
Area-P 1/2: 4.04 cm2
S' Lateral: 1.8 cm

## 2023-08-24 ENCOUNTER — Other Ambulatory Visit (HOSPITAL_BASED_OUTPATIENT_CLINIC_OR_DEPARTMENT_OTHER): Payer: Self-pay

## 2023-08-24 MED ORDER — FLUAD 0.5 ML IM SUSY
0.5000 mL | PREFILLED_SYRINGE | Freq: Once | INTRAMUSCULAR | 0 refills | Status: AC
  Filled 2023-08-24: qty 0.5, 1d supply, fill #0

## 2023-08-25 ENCOUNTER — Ambulatory Visit: Payer: Medicare PPO | Admitting: Physician Assistant

## 2023-08-27 ENCOUNTER — Other Ambulatory Visit (HOSPITAL_BASED_OUTPATIENT_CLINIC_OR_DEPARTMENT_OTHER): Payer: Self-pay

## 2023-08-27 MED ORDER — COVID-19 MRNA VAC-TRIS(PFIZER) 30 MCG/0.3ML IM SUSY
0.3000 mL | PREFILLED_SYRINGE | Freq: Once | INTRAMUSCULAR | 0 refills | Status: AC
  Filled 2023-08-27: qty 0.3, 1d supply, fill #0

## 2023-09-09 DIAGNOSIS — G4733 Obstructive sleep apnea (adult) (pediatric): Secondary | ICD-10-CM | POA: Diagnosis not present

## 2023-10-05 DIAGNOSIS — R7989 Other specified abnormal findings of blood chemistry: Secondary | ICD-10-CM | POA: Diagnosis not present

## 2023-10-05 DIAGNOSIS — E039 Hypothyroidism, unspecified: Secondary | ICD-10-CM | POA: Diagnosis not present

## 2023-10-05 DIAGNOSIS — N951 Menopausal and female climacteric states: Secondary | ICD-10-CM | POA: Diagnosis not present

## 2023-10-05 DIAGNOSIS — R002 Palpitations: Secondary | ICD-10-CM | POA: Diagnosis not present

## 2023-10-14 DIAGNOSIS — D649 Anemia, unspecified: Secondary | ICD-10-CM | POA: Diagnosis not present

## 2023-10-14 DIAGNOSIS — I471 Supraventricular tachycardia, unspecified: Secondary | ICD-10-CM | POA: Diagnosis not present

## 2023-10-14 DIAGNOSIS — E049 Nontoxic goiter, unspecified: Secondary | ICD-10-CM | POA: Diagnosis not present

## 2023-10-14 DIAGNOSIS — M810 Age-related osteoporosis without current pathological fracture: Secondary | ICD-10-CM | POA: Diagnosis not present

## 2023-10-14 DIAGNOSIS — K5901 Slow transit constipation: Secondary | ICD-10-CM | POA: Diagnosis not present

## 2023-10-14 DIAGNOSIS — E8801 Alpha-1-antitrypsin deficiency: Secondary | ICD-10-CM | POA: Diagnosis not present

## 2023-10-14 DIAGNOSIS — J449 Chronic obstructive pulmonary disease, unspecified: Secondary | ICD-10-CM | POA: Diagnosis not present

## 2023-10-14 DIAGNOSIS — I7300 Raynaud's syndrome without gangrene: Secondary | ICD-10-CM | POA: Diagnosis not present

## 2023-10-20 ENCOUNTER — Encounter: Payer: Self-pay | Admitting: Physician Assistant

## 2023-10-20 ENCOUNTER — Ambulatory Visit: Payer: Medicare PPO | Attending: Physician Assistant | Admitting: Physician Assistant

## 2023-10-20 ENCOUNTER — Telehealth: Payer: Self-pay | Admitting: Cardiology

## 2023-10-20 VITALS — BP 110/88 | HR 68 | Ht 67.0 in | Wt 107.0 lb

## 2023-10-20 DIAGNOSIS — I493 Ventricular premature depolarization: Secondary | ICD-10-CM | POA: Diagnosis not present

## 2023-10-20 DIAGNOSIS — R002 Palpitations: Secondary | ICD-10-CM | POA: Diagnosis not present

## 2023-10-20 DIAGNOSIS — I4510 Unspecified right bundle-branch block: Secondary | ICD-10-CM

## 2023-10-20 DIAGNOSIS — I700 Atherosclerosis of aorta: Secondary | ICD-10-CM

## 2023-10-20 DIAGNOSIS — I4719 Other supraventricular tachycardia: Secondary | ICD-10-CM | POA: Diagnosis not present

## 2023-10-20 MED ORDER — DILTIAZEM HCL ER COATED BEADS 120 MG PO CP24: 120.0000 mg | ORAL_CAPSULE | Freq: Every day | ORAL | 3 refills | Status: DC

## 2023-10-20 MED ORDER — METOPROLOL SUCCINATE ER 25 MG PO TB24: 12.5000 mg | ORAL_TABLET | Freq: Every day | ORAL | 3 refills | Status: DC

## 2023-10-20 NOTE — Progress Notes (Signed)
Cardiology Office Note:    Date:  10/20/2023   ID:  Michele Jones, DOB 10/30/1951, MRN [id number]  PCP:  [facility] @ [facility] Providers Cardiologist:  Armanda Magic, MD     Referring MD: [facility]   No chief complaint on file. SOB, chest pain  History of Present Illness:    Michele Jones is a 72 y.o. female with a hx of paroxysmal tachycardia on Cardizem, symptomatic PACs/PVCs, RBBB, coronary calcium score of 0 by CCTA 01/2018, alpha 1 antitrypsin deficiency, COPD, bronchiectasis, Sjogren's syndrome, aortic atherosclerosis, history of malaria, OSA, and lower extremity swelling managed with Lasix as needed.  Echocardiogram 12/2020 with an LVEF 65-70%, no RWMA, normal RV, trivial MR, AV sclerosis without stenosis.  Heart monitor 12/2017 with frequent PACs and nonsustained atrial tachycardia up to 19 beats.  She was last seen in clinic by Dr. Mayford Knife on 07/19/2023 with palpitations at night x 10 days.  She reported compliance with CPAP.  She was continued on Cardizem CD 120 mg daily.  She obtained a 14-day Zio patch which revealed increased episodes of atrial tachycardia that correlated with symptoms of palpitations.  Cardizem was increased.  Echocardiogram was obtained 08/12/2023 with an LVEF 65-70%, ventricular global longitudinal strain -28.5%, mildly enlarged RV, no significant valvular disease.  She called our office reporting chest pain and shortness of breath along with fatigue and was added to my schedule today as an acute visit.  She is taking 180 mg cardizem and reports that her heart beat feels "strained." She reports fatigue and a sensation of a 'struggling' heart. She describes the sensation as a 'heavy heart' and a 'strain' between beats. This is a change from her typical rapid heartbeat. The symptoms began about a month ago and have been associated with occasional lightheadedness and headaches. She also reports  occasional left axillary pain, not associated with exertion.  The patient has been taking Cardizem 180mg  daily for her heart condition, with an additional 30mg  as needed for rapid heartbeat. She also has a history of alpha-1 antitrypsin protein deficiency, thyroid concerns, and primary Sjogren's syndrome, which she believes may be contributing to her fatigue. She reports that her fatigue has been particularly severe recently, to the point of struggling to get out of bed in the morning.  Past Medical History:  Diagnosis Date   Allergy    Aortic atherosclerosis (HCC)    noted in abdominal aorta by CT 09/2020   Bronchiectasis (HCC)    COPD (chronic obstructive pulmonary disease) (HCC)    alpha 1 antitrypsin deficiency   Fibromyalgia    History of nuclear stress test    Myoview 9/23: EF 87, no ischemia or scar; excellent exercise capacity (Ex 9"); low risk   Malaria    OSA (obstructive sleep apnea)    mild with AHI 5.8/hr now on CPAP at 10cm H2o   Osteoporosis    PAC (premature atrial contraction)    Paroxysmal atrial tachycardia (HCC)    PVC's (premature ventricular contractions)    RBBB    no ischemia on nuclear stree test    Past Surgical History:  Procedure Laterality Date   APPENDECTOMY     TEMPOROMANDIBULAR JOINT SURGERY      Current Medications: Current Meds  Medication Sig   azithromycin (ZITHROMAX) 500 MG tablet Take 1 tablet by mouth 3 (three) times a week.   b complex vitamins tablet Take 1 tablet by mouth daily.   Boron 3 MG  CAPS Take by mouth daily.   Calcium-Vitamin A-Vitamin D (LIQUID CALCIUM PO) Take 5 mLs by mouth daily.   ciprofloxacin (CIPRO) 500 MG tablet Take 500 mg by mouth as needed.   clobetasol (TEMOVATE) 0.05 % external solution Apply 1 Application topically 2 (two) times daily.   diltiazem (CARDIZEM CD) 120 MG 24 hr capsule Take 1 capsule (120 mg total) by mouth daily.   diltiazem (CARDIZEM) 30 MG tablet TAKE 1 TABLET (30 MG TOTAL) BY MOUTH DAILY AS  NEEDED. FOR PALPITATIONS   glucosamine-chondroitin 500-400 MG tablet Take 1 tablet by mouth daily.    Grape Seed 100 MG CAPS Take by mouth 2 (two) times daily.   metoprolol succinate (TOPROL XL) 25 MG 24 hr tablet Take 0.5 tablets (12.5 mg total) by mouth daily.   Multiple Vitamins-Minerals (MULTIVITAMIN WITH MINERALS) tablet Take 1 tablet by mouth daily.   Olive Leaf Extract 150 MG CAPS Take by mouth daily.   rosuvastatin (CRESTOR) 5 MG tablet Take 5 mg by mouth 2 (two) times a week.   STIOLTO RESPIMAT 2.5-2.5 MCG/ACT AERS 2 puffs daily   zolpidem (AMBIEN) 10 MG tablet Take 10 mg by mouth at bedtime as needed for sleep.   [DISCONTINUED] diltiazem (CARDIZEM CD) 180 MG 24 hr capsule TAKE 1 CAPSULE BY MOUTH EVERY DAY     Allergies:   Amoxicillin, Molds & smuts, Septra [sulfamethoxazole-trimethoprim], Cefdinir, Clindamycin, Clindamycin hcl, Augmentin [amoxicillin-pot clavulanate], Latex, Levofloxacin, Penicillins, and Tetracycline   Social History   Socioeconomic History   Marital status: Married    Spouse name: Not on file   Number of children: Not on file   Years of education: Not on file   Highest education level: Not on file  Occupational History   Not on file  Tobacco Use   Smoking status: Never   Smokeless tobacco: Never  Vaping Use   Vaping status: Never Used  Substance and Sexual Activity   Alcohol use: No    Alcohol/week: 0.0 standard drinks of alcohol   Drug use: No   Sexual activity: Not on file  Other Topics Concern   Not on file  Social History Narrative   Not on file   Social Determinants of Health   Financial Resource Strain: Not on file  Food Insecurity: Not on file  Transportation Needs: Not on file  Physical Activity: Not on file  Stress: Not on file  Social Connections: Not on file     Family History: The patient's family history includes Cancer in her father and maternal grandmother; Diabetes in her paternal grandmother; Hypertension in her brother;  Stroke in her mother.  ROS:   Please see the history of present illness.     All other systems reviewed and are negative.  EKGs/Labs/Other Studies Reviewed:    The following studies were reviewed today: Cardiac Studies & Procedures     STRESS TESTS  MYOCARDIAL PERFUSION IMAGING 08/17/2022  Narrative   LV perfusion is normal. There is no evidence of ischemia. There is no evidence of infarction.   Left ventricular function is normal. Nuclear stress EF: 87 %. The left ventricular ejection fraction is hyperdynamic (>65%). End diastolic cavity size is normal. End systolic cavity size is normal.   Excellent exercise capacity (9:00 min:s; 11.9 METS). Normal HR/BP response to exercise.   The study is normal. The study is low risk.   ECHOCARDIOGRAM  ECHOCARDIOGRAM COMPLETE 08/12/2023  Narrative ECHOCARDIOGRAM REPORT    Patient Name:   ILEY SCHIMPF Date of Exam: 08/12/2023  Medical Rec #:  [id number]         Height:       67.0 in Accession #:    [id number]        Weight:       107.0 lb Date of Birth:  [birth year]         BSA:          1.550 m Patient Age:    72 years          BP:           140/80 mmHg Patient Gender: F                 HR:           76 bpm. Exam Location:  [address]  Procedure: 2D Echo, Cardiac Doppler, Color Doppler, 3D Echo and Strain Analysis  Indications:    R00.2 Palpitations; R00.8 Other abnormalities of heart beat  History:        Patient has prior history of Echocardiogram examinations, most recent 01/09/2021. COPD, Arrythmias:PVC, PAC and RBBB, Signs/Symptoms:Shortness of Breath, Chest Pain and Fatigue; Risk Factors:Sleep Apnea. Nonsustained atrial tachycardia. Lower extremity edema. Fibromyalgia. Aortic atherosclerosis. Dysnea on exertion.  Sonographer:    Cathie Beams RCS Referring Phys: [id number] TRACI R TURNER  IMPRESSIONS   1. Left ventricular ejection fraction, by estimation, is 65 to 70%. Left ventricular ejection fraction by 3D volume is 72  %. The left ventricle has normal function. The left ventricle has no regional wall motion abnormalities. Left ventricular diastolic parameters were normal. The average left ventricular global longitudinal strain is -28.5 %. The global longitudinal strain is normal. 2. Right ventricular systolic function is normal. The right ventricular size is mildly enlarged. There is normal pulmonary artery systolic pressure. The estimated right ventricular systolic pressure is 25.1 mmHg. 3. Right atrial size was mildly dilated. 4. Borderline bileaflet mitral valve prolapse. The mitral valve is grossly normal. Trivial mitral valve regurgitation. No evidence of mitral stenosis. 5. The aortic valve is tricuspid. There is mild thickening of the aortic valve. Aortic valve regurgitation is not visualized. No aortic stenosis is present. 6. The inferior vena cava is normal in size with greater than 50% respiratory variability, suggesting right atrial pressure of 3 mmHg.  FINDINGS Left Ventricle: Left ventricular ejection fraction, by estimation, is 65 to 70%. Left ventricular ejection fraction by 3D volume is 72 %. The left ventricle has normal function. The left ventricle has no regional wall motion abnormalities. The average left ventricular global longitudinal strain is -28.5 %. The global longitudinal strain is normal. The left ventricular internal cavity size was normal in size. There is no left ventricular hypertrophy. Left ventricular diastolic parameters were normal.  Right Ventricle: The right ventricular size is mildly enlarged. No increase in right ventricular wall thickness. Right ventricular systolic function is normal. There is normal pulmonary artery systolic pressure. The tricuspid regurgitant velocity is 2.35 m/s, and with an assumed right atrial pressure of 3 mmHg, the estimated right ventricular systolic pressure is 25.1 mmHg.  Left Atrium: Left atrial size was normal in size.  Right Atrium: Right  atrial size was mildly dilated.  Pericardium: There is no evidence of pericardial effusion.  Mitral Valve: Borderline bileaflet mitral valve prolapse. The mitral valve is grossly normal. Trivial mitral valve regurgitation. No evidence of mitral valve stenosis.  Tricuspid Valve: The tricuspid valve is normal in structure. Tricuspid valve regurgitation is mild . No evidence of tricuspid stenosis.  Aortic Valve: The  aortic valve is tricuspid. There is mild thickening of the aortic valve. Aortic valve regurgitation is not visualized. No aortic stenosis is present.  Pulmonic Valve: The pulmonic valve was normal in structure. Pulmonic valve regurgitation is trivial. No evidence of pulmonic stenosis.  Aorta: The aortic root is normal in size and structure.  Venous: The inferior vena cava is normal in size with greater than 50% respiratory variability, suggesting right atrial pressure of 3 mmHg.  IAS/Shunts: No atrial level shunt detected by color flow Doppler.   LEFT VENTRICLE PLAX 2D LVIDd:         3.30 cm         Diastology LVIDs:         1.80 cm         LV e' medial:    9.90 cm/s LV PW:         0.70 cm         LV E/e' medial:  8.5 LV IVS:        0.80 cm         LV e' lateral:   9.36 cm/s LVOT diam:     1.80 cm         LV E/e' lateral: 9.0 LV SV:         61 LV SV Index:   39              2D LVOT Area:     2.54 cm        Longitudinal Strain 2D Strain GLS  -28.5 % Avg:  3D Volume EF LV 3D EF:    Left ventricul ar ejection fraction by 3D volume is 72 %.  3D Volume EF: 3D EF:        72 % LV EDV:       75 ml LV ESV:       21 ml LV SV:        54 ml  RIGHT VENTRICLE RV Basal diam:  2.90 cm RV S prime:     12.10 cm/s TAPSE (M-mode): 2.4 cm RVSP:           25.1 mmHg  LEFT ATRIUM             Index        RIGHT ATRIUM           Index LA diam:        2.50 cm 1.61 cm/m   RA Pressure: 3.00 mmHg LA Vol (A2C):   26.9 ml 17.35 ml/m  RA Area:     18.70 cm LA Vol (A4C):   26.4  ml 17.03 ml/m  RA Volume:   51.10 ml  32.96 ml/m LA Biplane Vol: 26.8 ml 17.29 ml/m AORTIC VALVE LVOT Vmax:   113.00 cm/s LVOT Vmean:  70.800 cm/s LVOT VTI:    0.239 m  AORTA Ao Root diam: 2.80 cm Ao Asc diam:  2.90 cm  MITRAL VALVE               TRICUSPID VALVE MV Area (PHT): 4.04 cm    TR Peak grad:   22.1 mmHg MV Decel Time: 188 msec    TR Vmax:        235.00 cm/s MV E velocity: 83.80 cm/s  Estimated RAP:  3.00 mmHg MV A velocity: 75.50 cm/s  RVSP:           25.1 mmHg MV E/A ratio:  1.11 SHUNTS Systemic VTI:  0.24 m Systemic Diam: 1.80 cm  Weston Brass MD Electronically signed by Weston Brass MD Signature Date/Time: 08/13/2023/9:41:45 AM    Final    MONITORS  LONG TERM MONITOR (3-14 DAYS) 08/11/2023  Narrative   Predominant rhythm was normal sinus rhythm with an average heart rate of 68 bpm and ranged from 47 to 114 bpm   Intermittent interventricular conduction delay noted   Multiple episodes of SVT consistent with nonsustained atrial tachycardia with longest interval lasting 15 beats and fastest heart rate 139 bpm.  These episodes correlated with patient's symptoms.   Rare PACs, atrial couplets and triplets   Rare PVCs   Patch Wear Time:  13 days and 23 hours (2024-08-21T20:59:45-0400 to 2024-09-04T20:59:41-0400)  Patient had a min HR of 47 bpm, max HR of 139 bpm, and avg HR of 68 bpm. Predominant underlying rhythm was Sinus Rhythm. Slight P wave morphology changes were noted. Bundle Branch Block/IVCD was present. 32 Supraventricular Tachycardia runs occurred, the run with the fastest interval lasting 4 beats with a max rate of 139 bpm, the longest lasting 15 beats with an avg rate of 102 bpm. Supraventricular Tachycardia was detected within +/- 45 seconds of symptomatic patient event(s). Isolated SVEs were rare (<1.0%), SVE Couplets were rare (<1.0%), and SVE Triplets were rare (<1.0%). Isolated VEs were rare (<1.0%), and no VE Couplets or VE Triplets were  present.   CT SCANS  CT CORONARY MORPH W/CTA COR W/SCORE 02/25/2018  Addendum 02/25/2018  5:13 PM ADDENDUM REPORT: 02/25/2018 17:10  CLINICAL DATA:  Chest pain  EXAM: Cardiac CTA  MEDICATIONS: Sub lingual nitro. 4mg  x 2  TECHNIQUE: The patient was scanned on a Siemens 192 slice scanner. Gantry rotation speed was 250 msecs. Collimation was 0.6 mm. A 100 kV prospective scan was triggered in the ascending thoracic aorta at 35-75% of the R-R interval. Average HR during the scan was 60 bpm. The 3D data set was interpreted on a dedicated work station using MPR, MIP and VRT modes. A total of 80cc of contrast was used.  FINDINGS: Non-cardiac: See separate report from [facility].  Calcium Score: 0 Agatston units.  Coronary Arteries: Right dominant with no anomalies  LM: No plaque or stenosis.  LAD system: No plaque or stenosis.  Circumflex system: Relatively small vessel.  No plaque or stenosis.  RCA system: No plaque or stenosis  IMPRESSION: 1. Coronary artery calcium score 0 Agatston units, suggesting low risk for future cardiac events.  2.  No plaque or stenosis noted in the coronary arteries.  Dalton Mclean   Electronically Signed By: Marca Ancona M.D. On: 02/25/2018 17:10  Narrative EXAM: OVER-READ INTERPRETATION  CT CHEST  The following report is an over-read performed by radiologist Dr. Charlett Nose of [facility] on 02/25/2018. This over-read does not include interpretation of cardiac or coronary anatomy or pathology. The coronary CTA interpretation by the cardiologist is attached.  COMPARISON:  None.  FINDINGS: Vascular: Heart is normal size.  Visualized aorta is normal caliber.  Mediastinum/Nodes: No adenopathy in the lower mediastinum or hila.  Lungs/Pleura: Hyperinflation. Linear scarring in the lung bases. No effusions.  Upper Abdomen: Imaging into the upper abdomen shows no acute findings.  Musculoskeletal: Chest  wall soft tissues are unremarkable. No acute bony abnormality.  IMPRESSION: Hyperinflation of the lungs. Bibasilar scarring. No acute extra cardiac abnormality.  Electronically Signed: By: Charlett Nose M.D. On: 02/25/2018 15:25           EKG Interpretation Date/Time:  Wednesday October 20 2023 15:41:20 EST Ventricular Rate:  68 PR Interval:  162 QRS Duration:  124 QT Interval:  430 QTC Calculation: 457 R Axis:   89  Text Interpretation: Normal sinus rhythm Right bundle branch block When compared with ECG of 19-Jul-2023 08:33, No significant change was found Confirmed by Micah Flesher ([id number]) on 10/20/2023 3:44:46 PM    Recent Labs: 07/19/2023: BUN 13; Creatinine, Ser 0.90; Magnesium 2.1; Potassium 4.2; Sodium 142; TSH 4.210  Recent Lipid Panel No results found for: "CHOL", "TRIG", "HDL", "CHOLHDL", "VLDL", "LDLCALC", "LDLDIRECT"   Risk Assessment/Calculations:                Physical Exam:    VS:  BP 110/88   Pulse 68   Ht 5\' 7"  (1.702 m)   Wt 107 lb (48.5 kg)   SpO2 99%   BMI 16.76 kg/m     Wt Readings from Last 3 Encounters:  10/20/23 107 lb (48.5 kg)  07/19/23 107 lb (48.5 kg)  12/09/22 110 lb (49.9 kg)     GEN:  Well nourished, well developed in no acute distress HEENT: Normal NECK: No JVD; No carotid bruits LYMPHATICS: No lymphadenopathy CARDIAC: RRR, no murmurs, rubs, gallops RESPIRATORY:  Clear to auscultation without rales, wheezing or rhonchi  ABDOMEN: Soft, non-tender, non-distended MUSCULOSKELETAL:  No edema; No deformity  SKIN: Warm and dry NEUROLOGIC:  Alert and oriented x 3 PSYCHIATRIC:  Normal affect   ASSESSMENT:    1. Palpitations   2. PVC's (premature ventricular contractions)   3. Paroxysmal atrial tachycardia (HCC)   4. RBBB   5. Aortic atherosclerosis (HCC)    PLAN:    In order of problems listed above:  Assessment and Plan    Atrial Tachycardia Patient reports a change in heart rhythm sensation, fatigue, and  occasional left axillary pain. Currently on Diltiazem 180mg  daily with additional 30mg  as needed for rapid heartbeat. Unclear if symptoms are due to uncontrolled atrial tachycardia or side effects from Diltiazem. -Decrease Diltiazem to 120mg  daily. -Add Metoprolol 25mg  at night, starting with half a tablet for a week then increasing to a full tablet. -Notify Dr. Mayford Knife of changes in medication regimen.  Mitral Valve Prolapse Mild prolapse noted on recent echocardiogram. -Plan to repeat echocardiogram in one year.  Fatigue Chronic issue, exacerbated recently. Possible causes include cardiac issues, thyroid disease, Sjogren's syndrome, and Alpha-1 Antitrypsin Deficiency. -Adjust cardiac medications as above and monitor for improvement in fatigue. -Ensure other conditions are stable and well-managed.  Lightheadedness Recent episodes, possibly related to cardiac issues. -Monitor following adjustment of cardiac medications.      Follow up in 1 month.         Medication Adjustments/Labs and Tests Ordered: Current medicines are reviewed at length with the patient today.  Concerns regarding medicines are outlined above.  Orders Placed This Encounter  Procedures   EKG 12-Lead   Meds ordered this encounter  Medications   diltiazem (CARDIZEM CD) 120 MG 24 hr capsule    Sig: Take 1 capsule (120 mg total) by mouth daily.    Dispense:  90 capsule    Refill:  3   metoprolol succinate (TOPROL XL) 25 MG 24 hr tablet    Sig: Take 0.5 tablets (12.5 mg total) by mouth daily.    Dispense:  45 tablet    Refill:  3    Patient Instructions  Medication Instructions:  Decrease Cardizem 120 mg daily If you're not feeling better in 2 week, please start Toprol XL 12.5 mg daily  *If you need a  refill on your cardiac medications before your next appointment, please call your pharmacy*   Lab Work: NONE ordered at this time of appointment   Testing/Procedures: NONE ordered at this time of  appointment     Follow-Up: At [facility], you and your health needs are our priority.  As part of our continuing mission to provide you with exceptional heart care, we have created designated Provider Care Teams.  These Care Teams include your primary Cardiologist (physician) and Advanced Practice Providers (APPs -  Physician Assistants and Nurse Practitioners) who all work together to provide you with the care you need, when you need it.  We recommend signing up for the patient portal called "MyChart".  Sign up information is provided on this After Visit Summary.  MyChart is used to connect with patients for Virtual Visits (Telemedicine).  Patients are able to view lab/test results, encounter notes, upcoming appointments, etc.  Non-urgent messages can be sent to your provider as well.   To learn more about what you can do with MyChart, go to [email or web address].    Your next appointment:   1 month(s)  Provider:   Armanda Magic, MD  or Micah Flesher PA          Other Instructions     Signed, Marcelino Duster, PA  10/20/2023 4:15 PM    [facility]

## 2023-10-20 NOTE — Telephone Encounter (Signed)
   Pt c/o of Chest Pain: STAT if active CP, including tightness, pressure, jaw pain, radiating pain to shoulder/upper arm/back, CP unrelieved by Nitro. Symptoms reported of SOB, nausea, vomiting, sweating.  1. Are you having CP right now? Heart feel like it is struggling -not at this time   2. Are you experiencing any other symptoms (ex. SOB, nausea, vomiting, sweating)? Shortness of brath and feels fatigued when this happen   3. Is your CP continuous or coming and going? Comes and goes   4. Have you taken Nitroglycerin? Do not have any of that   5. How long have you been experiencing CP? About a month - please call to evaluate her- I made her an appointment for this afternoon with Micah Flesher   6. If NO CP at time of call then end call with telling Pt to call back or call 911 if Chest pain returns prior to return call from triage team.

## 2023-10-20 NOTE — Patient Instructions (Signed)
Medication Instructions:  Decrease Cardizem 120 mg daily If you're not feeling better in 2 week, please start Toprol XL 12.5 mg daily  *If you need a refill on your cardiac medications before your next appointment, please call your pharmacy*   Lab Work: NONE ordered at this time of appointment   Testing/Procedures: NONE ordered at this time of appointment     Follow-Up: At [facility], you and your health needs are our priority.  As part of our continuing mission to provide you with exceptional heart care, we have created designated Provider Care Teams.  These Care Teams include your primary Cardiologist (physician) and Advanced Practice Providers (APPs -  Physician Assistants and Nurse Practitioners) who all work together to provide you with the care you need, when you need it.  We recommend signing up for the patient portal called "MyChart".  Sign up information is provided on this After Visit Summary.  MyChart is used to connect with patients for Virtual Visits (Telemedicine).  Patients are able to view lab/test results, encounter notes, upcoming appointments, etc.  Non-urgent messages can be sent to your provider as well.   To learn more about what you can do with MyChart, go to [email or web address].    Your next appointment:   1 month(s)  Provider:   Armanda Magic, MD  or Micah Flesher PA          Other Instructions

## 2023-10-20 NOTE — Telephone Encounter (Signed)
Past month has a new symptoms where she feels like her heart is having trouble getting to the next beat that is happening off and on. SOB that comes and goes. Patient complained of chronic fatigue. She has alpha trypsin 1 def and COPD with bronchiectasis and has chronic SOB and is followed at a clinic in [facility] by Pulmonology, but has not seen in a couple of years. Patient has appointment to day with APP. Patient will go to ED if her symptoms get worse. Will send message to Dr. Mayford Knife so she is aware.

## 2023-11-05 DIAGNOSIS — L308 Other specified dermatitis: Secondary | ICD-10-CM | POA: Diagnosis not present

## 2023-11-05 DIAGNOSIS — L570 Actinic keratosis: Secondary | ICD-10-CM | POA: Diagnosis not present

## 2023-11-13 NOTE — Progress Notes (Unsigned)
Cardiology Office Note:    Date:  11/18/2023   ID:  Michele Jones, DOB May 21, 1951, MRN [id number]  PCP:  [facility] Providers Cardiologist:  Armanda Magic, MD Cardiology APP:  Marcelino Duster, PA     Referring MD: [facility]   Chief Complaint  Patient presents with   Follow-up    Heart strain    History of Present Illness:    Michele Jones is a 72 y.o. female with a hx of paroxysmal tachycardia on Cardizem, symptomatic PACs/PVCs, RBBB, coronary calcium score of 0 by CCTA 01/2018, alpha 1 antitrypsin deficiency, COPD, bronchiectasis, Sjogren's syndrome, aortic atherosclerosis, history of malaria, OSA, and lower extremity swelling managed with Lasix as needed.  Echocardiogram 12/2020 with an LVEF 65-70%, no RWMA, normal RV, trivial MR, AV sclerosis without stenosis.  Heart monitor 12/2017 with frequent PACs and nonsustained atrial tachycardia up to 19 beats.  She was last seen in clinic by Dr. Mayford Knife on 07/19/2023 with palpitations at night x 10 days.  She reported compliance with CPAP.  She was continued on Cardizem CD1 20 mg daily.  She obtained a 14-day Zio patch which revealed increased episodes of atrial tachycardia that correlated with symptoms of palpitations.  Cardizem was increased.  Echocardiogram was obtained 08/12/2023 with an LVEF 65-70%, ventricular global longitudinal strain -28.5%, mildly enlarged RV, no significant valvular disease.  She called our office reporting chest pain and shortness of breath along with fatigue and was added to my schedule as an acute visit on 10/20/2023.  She reported heart straining and recent fatigue.  I questioned if she was having increasing atrial tachycardia.  I opted to add metoprolol 25 mg at night and reduced diltiazem to 120 mg daily  She presents for follow-up.  She is taking 120 mg cardizem, she never started the 12.5 mg toprol. She feels much better with the  medication changes. She has not been bothered with palpitations. She thinks her fatigue is slightly improved but thinks this is due to holiday busy-ness. I agree.    Past Medical History:  Diagnosis Date   Allergy    Aortic atherosclerosis (HCC)    noted in abdominal aorta by CT 09/2020   Bronchiectasis (HCC)    COPD (chronic obstructive pulmonary disease) (HCC)    alpha 1 antitrypsin deficiency   Fibromyalgia    History of nuclear stress test    Myoview 9/23: EF 87, no ischemia or scar; excellent exercise capacity (Ex 9"); low risk   Malaria    OSA (obstructive sleep apnea)    mild with AHI 5.8/hr now on CPAP at 10cm H2o   Osteoporosis    PAC (premature atrial contraction)    Paroxysmal atrial tachycardia (HCC)    PVC's (premature ventricular contractions)    RBBB    no ischemia on nuclear stree test    Past Surgical History:  Procedure Laterality Date   APPENDECTOMY     TEMPOROMANDIBULAR JOINT SURGERY      Current Medications: Current Meds  Medication Sig   azithromycin (ZITHROMAX) 500 MG tablet Take 1 tablet by mouth 3 (three) times a week.   b complex vitamins tablet Take 1 tablet by mouth daily.   Boron 3 MG CAPS Take by mouth daily.   Calcium-Vitamin A-Vitamin D (LIQUID CALCIUM PO) Take 5 mLs by mouth daily.   ciprofloxacin (CIPRO) 500 MG tablet Take 500 mg by mouth as needed.   clobetasol (TEMOVATE) 0.05 % external  solution Apply 1 Application topically 2 (two) times daily.   COVID-19 mRNA vaccine 2023-2024 (COMIRNATY) SUSP injection Inject into the muscle.   COVID-19 mRNA vaccine, Moderna, 100 MCG/0.5ML injection Inject into the muscle.   diltiazem (CARDIZEM CD) 120 MG 24 hr capsule Take 1 capsule (120 mg total) by mouth daily.   diltiazem (CARDIZEM) 30 MG tablet TAKE 1 TABLET (30 MG TOTAL) BY MOUTH DAILY AS NEEDED. FOR PALPITATIONS   glucosamine-chondroitin 500-400 MG tablet Take 1 tablet by mouth daily.    Grape Seed 100 MG CAPS Take by mouth 2 (two) times  daily.   metoprolol succinate (TOPROL XL) 25 MG 24 hr tablet Take 0.5 tablets (12.5 mg total) by mouth daily. (Patient taking differently: Take 12.5 mg by mouth daily as needed.)   Multiple Vitamins-Minerals (MULTIVITAMIN WITH MINERALS) tablet Take 1 tablet by mouth daily.   Olive Leaf Extract 150 MG CAPS Take by mouth daily.   rosuvastatin (CRESTOR) 5 MG tablet Take 5 mg by mouth 2 (two) times a week.   STIOLTO RESPIMAT 2.5-2.5 MCG/ACT AERS 2 puffs daily   zolpidem (AMBIEN) 10 MG tablet Take 10 mg by mouth at bedtime as needed for sleep.   [DISCONTINUED] diltiazem (CARDIZEM CD) 120 MG 24 hr capsule Take 1 capsule (120 mg total) by mouth daily.     Allergies:   Amoxicillin, Molds & smuts, Septra [sulfamethoxazole-trimethoprim], Cefdinir, Clindamycin, Clindamycin hcl, Augmentin [amoxicillin-pot clavulanate], Latex, Levofloxacin, Penicillins, and Tetracycline   Social History   Socioeconomic History   Marital status: Married    Spouse name: Not on file   Number of children: Not on file   Years of education: Not on file   Highest education level: Not on file  Occupational History   Not on file  Tobacco Use   Smoking status: Never   Smokeless tobacco: Never  Vaping Use   Vaping status: Never Used  Substance and Sexual Activity   Alcohol use: No    Alcohol/week: 0.0 standard drinks of alcohol   Drug use: No   Sexual activity: Not on file  Other Topics Concern   Not on file  Social History Narrative   Not on file   Social Drivers of Health   Financial Resource Strain: Not on file  Food Insecurity: Not on file  Transportation Needs: Not on file  Physical Activity: Not on file  Stress: Not on file  Social Connections: Not on file     Family History: The patient's family history includes Cancer in her father and maternal grandmother; Diabetes in her paternal grandmother; Hypertension in her brother; Stroke in her mother.  ROS:   Please see the history of present illness.      All other systems reviewed and are negative.  EKGs/Labs/Other Studies Reviewed:    The following studies were reviewed today:  Cardiac Studies & Procedures     STRESS TESTS  MYOCARDIAL PERFUSION IMAGING 08/17/2022  Narrative   LV perfusion is normal. There is no evidence of ischemia. There is no evidence of infarction.   Left ventricular function is normal. Nuclear stress EF: 87 %. The left ventricular ejection fraction is hyperdynamic (>65%). End diastolic cavity size is normal. End systolic cavity size is normal.   Excellent exercise capacity (9:00 min:s; 11.9 METS). Normal HR/BP response to exercise.   The study is normal. The study is low risk.  ECHOCARDIOGRAM  ECHOCARDIOGRAM COMPLETE 08/12/2023  Narrative ECHOCARDIOGRAM REPORT    Patient Name:   Michele Jones Date of Exam: 08/12/2023 Medical  Rec #:  [id number]         Height:       67.0 in Accession #:    [id number]        Weight:       107.0 lb Date of Birth:  11/16/1951         BSA:          1.550 m Patient Age:    72 years          BP:           140/80 mmHg Patient Gender: F                 HR:           76 bpm. Exam Location:  [address]  Procedure: 2D Echo, Cardiac Doppler, Color Doppler, 3D Echo and Strain Analysis  Indications:    R00.2 Palpitations; R00.8 Other abnormalities of heart beat  History:        Patient has prior history of Echocardiogram examinations, most recent 01/09/2021. COPD, Arrythmias:PVC, PAC and RBBB, Signs/Symptoms:Shortness of Breath, Chest Pain and Fatigue; Risk Factors:Sleep Apnea. Nonsustained atrial tachycardia. Lower extremity edema. Fibromyalgia. Aortic atherosclerosis. Dysnea on exertion.  Sonographer:    Cathie Beams RCS Referring Phys: [phone] TRACI R TURNER  IMPRESSIONS   1. Left ventricular ejection fraction, by estimation, is 65 to 70%. Left ventricular ejection fraction by 3D volume is 72 %. The left ventricle has normal function. The left ventricle has no regional  wall motion abnormalities. Left ventricular diastolic parameters were normal. The average left ventricular global longitudinal strain is -28.5 %. The global longitudinal strain is normal. 2. Right ventricular systolic function is normal. The right ventricular size is mildly enlarged. There is normal pulmonary artery systolic pressure. The estimated right ventricular systolic pressure is 25.1 mmHg. 3. Right atrial size was mildly dilated. 4. Borderline bileaflet mitral valve prolapse. The mitral valve is grossly normal. Trivial mitral valve regurgitation. No evidence of mitral stenosis. 5. The aortic valve is tricuspid. There is mild thickening of the aortic valve. Aortic valve regurgitation is not visualized. No aortic stenosis is present. 6. The inferior vena cava is normal in size with greater than 50% respiratory variability, suggesting right atrial pressure of 3 mmHg.  FINDINGS Left Ventricle: Left ventricular ejection fraction, by estimation, is 65 to 70%. Left ventricular ejection fraction by 3D volume is 72 %. The left ventricle has normal function. The left ventricle has no regional wall motion abnormalities. The average left ventricular global longitudinal strain is -28.5 %. The global longitudinal strain is normal. The left ventricular internal cavity size was normal in size. There is no left ventricular hypertrophy. Left ventricular diastolic parameters were normal.  Right Ventricle: The right ventricular size is mildly enlarged. No increase in right ventricular wall thickness. Right ventricular systolic function is normal. There is normal pulmonary artery systolic pressure. The tricuspid regurgitant velocity is 2.35 m/s, and with an assumed right atrial pressure of 3 mmHg, the estimated right ventricular systolic pressure is 25.1 mmHg.  Left Atrium: Left atrial size was normal in size.  Right Atrium: Right atrial size was mildly dilated.  Pericardium: There is no evidence of  pericardial effusion.  Mitral Valve: Borderline bileaflet mitral valve prolapse. The mitral valve is grossly normal. Trivial mitral valve regurgitation. No evidence of mitral valve stenosis.  Tricuspid Valve: The tricuspid valve is normal in structure. Tricuspid valve regurgitation is mild . No evidence of tricuspid stenosis.  Aortic Valve: The aortic  valve is tricuspid. There is mild thickening of the aortic valve. Aortic valve regurgitation is not visualized. No aortic stenosis is present.  Pulmonic Valve: The pulmonic valve was normal in structure. Pulmonic valve regurgitation is trivial. No evidence of pulmonic stenosis.  Aorta: The aortic root is normal in size and structure.  Venous: The inferior vena cava is normal in size with greater than 50% respiratory variability, suggesting right atrial pressure of 3 mmHg.  IAS/Shunts: No atrial level shunt detected by color flow Doppler.   LEFT VENTRICLE PLAX 2D LVIDd:         3.30 cm         Diastology LVIDs:         1.80 cm         LV e' medial:    9.90 cm/s LV PW:         0.70 cm         LV E/e' medial:  8.5 LV IVS:        0.80 cm         LV e' lateral:   9.36 cm/s LVOT diam:     1.80 cm         LV E/e' lateral: 9.0 LV SV:         61 LV SV Index:   39              2D LVOT Area:     2.54 cm        Longitudinal Strain 2D Strain GLS  -28.5 % Avg:  3D Volume EF LV 3D EF:    Left ventricul ar ejection fraction by 3D volume is 72 %.  3D Volume EF: 3D EF:        72 % LV EDV:       75 ml LV ESV:       21 ml LV SV:        54 ml  RIGHT VENTRICLE RV Basal diam:  2.90 cm RV S prime:     12.10 cm/s TAPSE (M-mode): 2.4 cm RVSP:           25.1 mmHg  LEFT ATRIUM             Index        RIGHT ATRIUM           Index LA diam:        2.50 cm 1.61 cm/m   RA Pressure: 3.00 mmHg LA Vol (A2C):   26.9 ml 17.35 ml/m  RA Area:     18.70 cm LA Vol (A4C):   26.4 ml 17.03 ml/m  RA Volume:   51.10 ml  32.96 ml/m LA Biplane Vol: 26.8  ml 17.29 ml/m AORTIC VALVE LVOT Vmax:   113.00 cm/s LVOT Vmean:  70.800 cm/s LVOT VTI:    0.239 m  AORTA Ao Root diam: 2.80 cm Ao Asc diam:  2.90 cm  MITRAL VALVE               TRICUSPID VALVE MV Area (PHT): 4.04 cm    TR Peak grad:   22.1 mmHg MV Decel Time: 188 msec    TR Vmax:        235.00 cm/s MV E velocity: 83.80 cm/s  Estimated RAP:  3.00 mmHg MV A velocity: 75.50 cm/s  RVSP:           25.1 mmHg MV E/A ratio:  1.11 SHUNTS Systemic VTI:  0.24 m Systemic Diam: 1.80 cm  Weston Brass MD Electronically signed by Weston Brass MD Signature Date/Time: 08/13/2023/9:41:45 AM    Final   MONITORS  LONG TERM MONITOR (3-14 DAYS) 08/11/2023  Narrative   Predominant rhythm was normal sinus rhythm with an average heart rate of 68 bpm and ranged from 47 to 114 bpm   Intermittent interventricular conduction delay noted   Multiple episodes of SVT consistent with nonsustained atrial tachycardia with longest interval lasting 15 beats and fastest heart rate 139 bpm.  These episodes correlated with patient's symptoms.   Rare PACs, atrial couplets and triplets   Rare PVCs   Patch Wear Time:  13 days and 23 hours (2024-08-21T20:59:45-0400 to 2024-09-04T20:59:41-0400)  Patient had a min HR of 47 bpm, max HR of 139 bpm, and avg HR of 68 bpm. Predominant underlying rhythm was Sinus Rhythm. Slight P wave morphology changes were noted. Bundle Branch Block/IVCD was present. 32 Supraventricular Tachycardia runs occurred, the run with the fastest interval lasting 4 beats with a max rate of 139 bpm, the longest lasting 15 beats with an avg rate of 102 bpm. Supraventricular Tachycardia was detected within +/- 45 seconds of symptomatic patient event(s). Isolated SVEs were rare (<1.0%), SVE Couplets were rare (<1.0%), and SVE Triplets were rare (<1.0%). Isolated VEs were rare (<1.0%), and no VE Couplets or VE Triplets were present.  CT SCANS  CT CORONARY MORPH W/CTA COR W/SCORE  02/25/2018  Addendum 02/25/2018  5:13 PM ADDENDUM REPORT: 02/25/2018 17:10  CLINICAL DATA:  Chest pain  EXAM: Cardiac CTA  MEDICATIONS: Sub lingual nitro. 4mg  x 2  TECHNIQUE: The patient was scanned on a Siemens 192 slice scanner. Gantry rotation speed was 250 msecs. Collimation was 0.6 mm. A 100 kV prospective scan was triggered in the ascending thoracic aorta at 35-75% of the R-R interval. Average HR during the scan was 60 bpm. The 3D data set was interpreted on a dedicated work station using MPR, MIP and VRT modes. A total of 80cc of contrast was used.  FINDINGS: Non-cardiac: See separate report from [facility].  Calcium Score: 0 Agatston units.  Coronary Arteries: Right dominant with no anomalies  LM: No plaque or stenosis.  LAD system: No plaque or stenosis.  Circumflex system: Relatively small vessel.  No plaque or stenosis.  RCA system: No plaque or stenosis  IMPRESSION: 1. Coronary artery calcium score 0 Agatston units, suggesting low risk for future cardiac events.  2.  No plaque or stenosis noted in the coronary arteries.  Dalton Mclean   Electronically Signed By: Marca Ancona M.D. On: 02/25/2018 17:10  Narrative EXAM: OVER-READ INTERPRETATION  CT CHEST  The following report is an over-read performed by radiologist Dr. Charlett Nose of [facility] on 02/25/2018. This over-read does not include interpretation of cardiac or coronary anatomy or pathology. The coronary CTA interpretation by the cardiologist is attached.  COMPARISON:  None.  FINDINGS: Vascular: Heart is normal size.  Visualized aorta is normal caliber.  Mediastinum/Nodes: No adenopathy in the lower mediastinum or hila.  Lungs/Pleura: Hyperinflation. Linear scarring in the lung bases. No effusions.  Upper Abdomen: Imaging into the upper abdomen shows no acute findings.  Musculoskeletal: Chest wall soft tissues are unremarkable. No acute bony  abnormality.  IMPRESSION: Hyperinflation of the lungs. Bibasilar scarring. No acute extra cardiac abnormality.  Electronically Signed: By: Charlett Nose M.D. On: 02/25/2018 15:25                Recent Labs: 07/19/2023: BUN 13; Creatinine, Ser 0.90; Magnesium 2.1;  Potassium 4.2; Sodium 142; TSH 4.210  Recent Lipid Panel No results found for: "CHOL", "TRIG", "HDL", "CHOLHDL", "VLDL", "LDLCALC", "LDLDIRECT"   Risk Assessment/Calculations:                Physical Exam:    VS:  BP 112/74   Pulse 72   Ht 5\' 7"  (1.702 m)   Wt 110 lb 6.4 oz (50.1 kg)   SpO2 99%   BMI 17.29 kg/m     Wt Readings from Last 3 Encounters:  11/18/23 110 lb 6.4 oz (50.1 kg)  10/20/23 107 lb (48.5 kg)  07/19/23 107 lb (48.5 kg)     GEN:  Well nourished, well developed in no acute distress HEENT: Normal NECK: No JVD; No carotid bruits LYMPHATICS: No lymphadenopathy CARDIAC: RRR, no murmurs, rubs, gallops RESPIRATORY:  Clear to auscultation without rales, wheezing or rhonchi  ABDOMEN: Soft, non-tender, non-distended MUSCULOSKELETAL:  No edema; No deformity  SKIN: Warm and dry NEUROLOGIC:  Alert and oriented x 3 PSYCHIATRIC:  Normal affect   ASSESSMENT:    1. Paroxysmal atrial tachycardia (HCC)   2. Leg edema   3. Palpitations   4. Atypical chest pain   5. OSA (obstructive sleep apnea)    PLAN:    In order of problems listed above:  Chest pain - resolved Shortness of breath - resolved Reassuring nuclear stress test 07/2022 No coronary calcium on CCTA 01/2018   Atrial tachycardia - 180 mg Cardizem --> now on 120 mg and doing well -Magnesium, BMP, TSH WNL in August 2024 -Follow-up echocardiogram reassuring -- never started toprol - discussed if she has palpitations several days in a row she may started 12.5 mg toprol at night, may also take PRN Mg   OSA on CPAP -100% compliance   Fatigue Chronic issue exacerbated recently --> doing a bit better, likely related to  holidays   Follow up on 6 months.           Medication Adjustments/Labs and Tests Ordered: Current medicines are reviewed at length with the patient today.  Concerns regarding medicines are outlined above.  No orders of the defined types were placed in this encounter.  Meds ordered this encounter  Medications   diltiazem (CARDIZEM CD) 120 MG 24 hr capsule    Sig: Take 1 capsule (120 mg total) by mouth daily.    Dispense:  90 capsule    Refill:  3    Patient Instructions  Medication Instructions:  Your physician recommends that you continue on your current medications as directed. Please refer to the Current Medication list given to you today.  *If you need a refill on your cardiac medications before your next appointment, please call your pharmacy*   Lab Work: NONE ordered at this time of appointment   Testing/Procedures: NONE ordered at this time of appointment   Follow-Up: At [facility], you and your health needs are our priority.  As part of our continuing mission to provide you with exceptional heart care, we have created designated Provider Care Teams.  These Care Teams include your primary Cardiologist (physician) and Advanced Practice Providers (APPs -  Physician Assistants and Nurse Practitioners) who all work together to provide you with the care you need, when you need it.  We recommend signing up for the patient portal called "MyChart".  Sign up information is provided on this After Visit Summary.  MyChart is used to connect with patients for Virtual Visits (Telemedicine).  Patients are able to view lab/test  results, encounter notes, upcoming appointments, etc.  Non-urgent messages can be sent to your provider as well.   To learn more about what you can do with MyChart, go to [email or web address].    Your next appointment:   6 month(s)  Provider:   Armanda Magic, MD  or Micah Flesher, PA-C        Other Instructions         Signed, Roe Rutherford Karron Goens, PA  11/18/2023 4:00 PM     [facility]

## 2023-11-18 ENCOUNTER — Ambulatory Visit: Payer: Medicare PPO | Attending: Physician Assistant | Admitting: Physician Assistant

## 2023-11-18 ENCOUNTER — Encounter: Payer: Self-pay | Admitting: Physician Assistant

## 2023-11-18 VITALS — BP 112/74 | HR 72 | Ht 67.0 in | Wt 110.4 lb

## 2023-11-18 DIAGNOSIS — R002 Palpitations: Secondary | ICD-10-CM | POA: Diagnosis not present

## 2023-11-18 DIAGNOSIS — R0789 Other chest pain: Secondary | ICD-10-CM | POA: Diagnosis not present

## 2023-11-18 DIAGNOSIS — I4719 Other supraventricular tachycardia: Secondary | ICD-10-CM | POA: Diagnosis not present

## 2023-11-18 DIAGNOSIS — R600 Localized edema: Secondary | ICD-10-CM | POA: Diagnosis not present

## 2023-11-18 DIAGNOSIS — G4733 Obstructive sleep apnea (adult) (pediatric): Secondary | ICD-10-CM

## 2023-11-18 MED ORDER — DILTIAZEM HCL ER COATED BEADS 120 MG PO CP24: 120.0000 mg | ORAL_CAPSULE | Freq: Every day | ORAL | 3 refills | Status: DC

## 2023-11-18 NOTE — Patient Instructions (Signed)
Medication Instructions:  Your physician recommends that you continue on your current medications as directed. Please refer to the Current Medication list given to you today.  *If you need a refill on your cardiac medications before your next appointment, please call your pharmacy*   Lab Work: NONE ordered at this time of appointment   Testing/Procedures: NONE ordered at this time of appointment   Follow-Up: At [facility], you and your health needs are our priority.  As part of our continuing mission to provide you with exceptional heart care, we have created designated Provider Care Teams.  These Care Teams include your primary Cardiologist (physician) and Advanced Practice Providers (APPs -  Physician Assistants and Nurse Practitioners) who all work together to provide you with the care you need, when you need it.  We recommend signing up for the patient portal called "MyChart".  Sign up information is provided on this After Visit Summary.  MyChart is used to connect with patients for Virtual Visits (Telemedicine).  Patients are able to view lab/test results, encounter notes, upcoming appointments, etc.  Non-urgent messages can be sent to your provider as well.   To learn more about what you can do with MyChart, go to [email or web address].    Your next appointment:   6 month(s)  Provider:   Armanda Magic, MD  or Micah Flesher, PA-C        Other Instructions

## 2023-12-08 DIAGNOSIS — G4733 Obstructive sleep apnea (adult) (pediatric): Secondary | ICD-10-CM | POA: Diagnosis not present

## 2023-12-20 DIAGNOSIS — Z1231 Encounter for screening mammogram for malignant neoplasm of breast: Secondary | ICD-10-CM | POA: Diagnosis not present

## 2023-12-22 DIAGNOSIS — D2271 Melanocytic nevi of right lower limb, including hip: Secondary | ICD-10-CM | POA: Diagnosis not present

## 2023-12-22 DIAGNOSIS — L821 Other seborrheic keratosis: Secondary | ICD-10-CM | POA: Diagnosis not present

## 2023-12-27 DIAGNOSIS — K449 Diaphragmatic hernia without obstruction or gangrene: Secondary | ICD-10-CM | POA: Diagnosis not present

## 2023-12-27 DIAGNOSIS — K2970 Gastritis, unspecified, without bleeding: Secondary | ICD-10-CM | POA: Diagnosis not present

## 2023-12-27 DIAGNOSIS — D649 Anemia, unspecified: Secondary | ICD-10-CM | POA: Diagnosis not present

## 2023-12-27 DIAGNOSIS — D509 Iron deficiency anemia, unspecified: Secondary | ICD-10-CM | POA: Diagnosis not present

## 2023-12-27 DIAGNOSIS — K2950 Unspecified chronic gastritis without bleeding: Secondary | ICD-10-CM | POA: Diagnosis not present

## 2023-12-27 DIAGNOSIS — K644 Residual hemorrhoidal skin tags: Secondary | ICD-10-CM | POA: Diagnosis not present

## 2023-12-27 DIAGNOSIS — K5730 Diverticulosis of large intestine without perforation or abscess without bleeding: Secondary | ICD-10-CM | POA: Diagnosis not present

## 2023-12-27 DIAGNOSIS — R1310 Dysphagia, unspecified: Secondary | ICD-10-CM | POA: Diagnosis not present

## 2023-12-27 DIAGNOSIS — K219 Gastro-esophageal reflux disease without esophagitis: Secondary | ICD-10-CM | POA: Diagnosis not present

## 2024-01-10 DIAGNOSIS — K219 Gastro-esophageal reflux disease without esophagitis: Secondary | ICD-10-CM | POA: Diagnosis not present

## 2024-01-10 DIAGNOSIS — R1310 Dysphagia, unspecified: Secondary | ICD-10-CM | POA: Diagnosis not present

## 2024-01-10 DIAGNOSIS — K5901 Slow transit constipation: Secondary | ICD-10-CM | POA: Diagnosis not present

## 2024-01-10 DIAGNOSIS — D649 Anemia, unspecified: Secondary | ICD-10-CM | POA: Diagnosis not present

## 2024-02-16 DIAGNOSIS — R0602 Shortness of breath: Secondary | ICD-10-CM | POA: Diagnosis not present

## 2024-02-16 DIAGNOSIS — R072 Precordial pain: Secondary | ICD-10-CM | POA: Diagnosis not present

## 2024-02-16 DIAGNOSIS — I700 Atherosclerosis of aorta: Secondary | ICD-10-CM | POA: Diagnosis not present

## 2024-02-16 DIAGNOSIS — R002 Palpitations: Secondary | ICD-10-CM | POA: Diagnosis not present

## 2024-02-16 DIAGNOSIS — R5383 Other fatigue: Secondary | ICD-10-CM | POA: Diagnosis not present

## 2024-02-16 DIAGNOSIS — I471 Supraventricular tachycardia, unspecified: Secondary | ICD-10-CM | POA: Diagnosis not present

## 2024-02-16 DIAGNOSIS — D649 Anemia, unspecified: Secondary | ICD-10-CM | POA: Diagnosis not present

## 2024-02-16 DIAGNOSIS — M797 Fibromyalgia: Secondary | ICD-10-CM | POA: Diagnosis not present

## 2024-02-16 DIAGNOSIS — E8801 Alpha-1-antitrypsin deficiency: Secondary | ICD-10-CM | POA: Diagnosis not present

## 2024-02-16 DIAGNOSIS — J449 Chronic obstructive pulmonary disease, unspecified: Secondary | ICD-10-CM | POA: Diagnosis not present

## 2024-02-28 DIAGNOSIS — H903 Sensorineural hearing loss, bilateral: Secondary | ICD-10-CM | POA: Diagnosis not present

## 2024-03-07 DIAGNOSIS — G4733 Obstructive sleep apnea (adult) (pediatric): Secondary | ICD-10-CM | POA: Diagnosis not present

## 2024-03-24 DIAGNOSIS — R638 Other symptoms and signs concerning food and fluid intake: Secondary | ICD-10-CM | POA: Diagnosis not present

## 2024-03-24 DIAGNOSIS — R1310 Dysphagia, unspecified: Secondary | ICD-10-CM | POA: Diagnosis not present

## 2024-03-24 DIAGNOSIS — J029 Acute pharyngitis, unspecified: Secondary | ICD-10-CM | POA: Diagnosis not present

## 2024-03-24 DIAGNOSIS — R5383 Other fatigue: Secondary | ICD-10-CM | POA: Diagnosis not present

## 2024-03-24 DIAGNOSIS — U071 COVID-19: Secondary | ICD-10-CM | POA: Diagnosis not present

## 2024-03-24 DIAGNOSIS — R0981 Nasal congestion: Secondary | ICD-10-CM | POA: Diagnosis not present

## 2024-03-24 DIAGNOSIS — R051 Acute cough: Secondary | ICD-10-CM | POA: Diagnosis not present

## 2024-03-24 DIAGNOSIS — Z1152 Encounter for screening for COVID-19: Secondary | ICD-10-CM | POA: Diagnosis not present

## 2024-04-05 DIAGNOSIS — R7989 Other specified abnormal findings of blood chemistry: Secondary | ICD-10-CM | POA: Diagnosis not present

## 2024-04-05 DIAGNOSIS — E039 Hypothyroidism, unspecified: Secondary | ICD-10-CM | POA: Diagnosis not present

## 2024-04-05 DIAGNOSIS — R5383 Other fatigue: Secondary | ICD-10-CM | POA: Diagnosis not present

## 2024-04-05 DIAGNOSIS — I4891 Unspecified atrial fibrillation: Secondary | ICD-10-CM | POA: Diagnosis not present

## 2024-04-05 DIAGNOSIS — N951 Menopausal and female climacteric states: Secondary | ICD-10-CM | POA: Diagnosis not present

## 2024-04-05 DIAGNOSIS — E049 Nontoxic goiter, unspecified: Secondary | ICD-10-CM | POA: Diagnosis not present

## 2024-04-05 DIAGNOSIS — Z5181 Encounter for therapeutic drug level monitoring: Secondary | ICD-10-CM | POA: Diagnosis not present

## 2024-04-05 DIAGNOSIS — D509 Iron deficiency anemia, unspecified: Secondary | ICD-10-CM | POA: Diagnosis not present

## 2024-04-05 DIAGNOSIS — R079 Chest pain, unspecified: Secondary | ICD-10-CM | POA: Diagnosis not present

## 2024-04-13 DIAGNOSIS — I7300 Raynaud's syndrome without gangrene: Secondary | ICD-10-CM | POA: Diagnosis not present

## 2024-04-13 DIAGNOSIS — E049 Nontoxic goiter, unspecified: Secondary | ICD-10-CM | POA: Diagnosis not present

## 2024-04-13 DIAGNOSIS — J449 Chronic obstructive pulmonary disease, unspecified: Secondary | ICD-10-CM | POA: Diagnosis not present

## 2024-04-13 DIAGNOSIS — D649 Anemia, unspecified: Secondary | ICD-10-CM | POA: Diagnosis not present

## 2024-04-13 DIAGNOSIS — E8801 Alpha-1-antitrypsin deficiency: Secondary | ICD-10-CM | POA: Diagnosis not present

## 2024-04-13 DIAGNOSIS — J479 Bronchiectasis, uncomplicated: Secondary | ICD-10-CM | POA: Diagnosis not present

## 2024-04-13 DIAGNOSIS — M810 Age-related osteoporosis without current pathological fracture: Secondary | ICD-10-CM | POA: Diagnosis not present

## 2024-04-13 DIAGNOSIS — M797 Fibromyalgia: Secondary | ICD-10-CM | POA: Diagnosis not present

## 2024-05-11 DIAGNOSIS — J479 Bronchiectasis, uncomplicated: Secondary | ICD-10-CM | POA: Diagnosis not present

## 2024-05-29 ENCOUNTER — Other Ambulatory Visit: Payer: Self-pay

## 2024-05-29 MED ORDER — DILTIAZEM HCL ER COATED BEADS 120 MG PO CP24: 120.0000 mg | ORAL_CAPSULE | Freq: Every day | ORAL | 1 refills | Status: DC

## 2024-05-29 MED ORDER — DILTIAZEM HCL 30 MG PO TABS: 30.0000 mg | ORAL_TABLET | Freq: Every day | ORAL | 1 refills | Status: DC | PRN

## 2024-06-06 DIAGNOSIS — G4733 Obstructive sleep apnea (adult) (pediatric): Secondary | ICD-10-CM | POA: Diagnosis not present

## 2024-06-06 DIAGNOSIS — J479 Bronchiectasis, uncomplicated: Secondary | ICD-10-CM | POA: Diagnosis not present

## 2024-06-13 ENCOUNTER — Ambulatory Visit: Attending: Cardiology | Admitting: Cardiology

## 2024-06-13 ENCOUNTER — Encounter: Payer: Self-pay | Admitting: Cardiology

## 2024-06-13 VITALS — BP 132/62 | HR 79 | Ht 67.0 in | Wt 109.0 lb

## 2024-06-13 DIAGNOSIS — I4719 Other supraventricular tachycardia: Secondary | ICD-10-CM

## 2024-06-13 DIAGNOSIS — I700 Atherosclerosis of aorta: Secondary | ICD-10-CM

## 2024-06-13 DIAGNOSIS — R600 Localized edema: Secondary | ICD-10-CM | POA: Diagnosis not present

## 2024-06-13 DIAGNOSIS — R0789 Other chest pain: Secondary | ICD-10-CM

## 2024-06-13 DIAGNOSIS — I493 Ventricular premature depolarization: Secondary | ICD-10-CM

## 2024-06-13 DIAGNOSIS — G4733 Obstructive sleep apnea (adult) (pediatric): Secondary | ICD-10-CM

## 2024-06-13 MED ORDER — DILTIAZEM HCL 30 MG PO TABS: 30.0000 mg | ORAL_TABLET | Freq: Every day | ORAL | 1 refills | Status: AC | PRN

## 2024-06-13 MED ORDER — DILTIAZEM HCL ER COATED BEADS 120 MG PO CP24: 120.0000 mg | ORAL_CAPSULE | Freq: Every day | ORAL | 1 refills | Status: AC

## 2024-06-13 NOTE — Progress Notes (Signed)
 Date:  06/13/2024   ID:  Michele Jones, DOB 1951/01/27, MRN [id number] The patient was identified using 2 identifiers.  PCP:  [facility] Providers Cardiologist:  Wilbert Bihari, MD Cardiology APP:  Michele Jones, [name]     Evaluation Performed:  Follow-Up Visit  Chief Complaint:  OSA, PVCs, PAT  History of Present Illness:    Michele Jones is a 73 y.o. female with a hx of symptomatic PVC's and PAC's, nonsustained atrial tachycardia, mild OSA on CPAP and diastolic dysfunction.  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  She has a history of RBBB and nuclear stress test 11/2016 was normal.  2D echo 2017 showed normal LVF .    She is doing well with her PAP device and thinks that she has gotten used to it.  She tolerates the mask and feels the pressure is adequate.  She has never really felt rested even when she was a teenager and still wakes up groggy as she gets older.  She does get sleepy some during the day but never has to nap.   She denies any significant nasal dryness or nasal congestion.  She does not think that she snores.  She does get mouth dryness but has sjogrens and uses mouth wash.    She is doing well from a cardiac standpoint and says that when she is feeling well she denies any chest pain, shortness of breath, dyspnea on exertion, PND, orthopnea, palpitations or syncope. Rarely she will have some ankle edema but very sporadic.  She says when she is generally not feeling well with colds or the COVID that she had, she has noticed more palpitations.   Past Medical History:  Diagnosis Date   Allergy    Aortic atherosclerosis (HCC)    noted in abdominal aorta by CT 09/2020   Bronchiectasis (HCC)    COPD (chronic obstructive pulmonary disease) (HCC)    alpha 1 antitrypsin deficiency   Fibromyalgia    History of nuclear stress test     Myoview  9/23: EF 87, no ischemia or scar; excellent exercise capacity (Ex 9); low risk   Malaria    OSA (obstructive sleep apnea)    mild with AHI 5.8/hr now on CPAP at 10cm H2o   Osteoporosis    PAC (premature atrial contraction)    Paroxysmal atrial tachycardia (HCC)    PVC's (premature ventricular contractions)    RBBB    no ischemia on nuclear stree test   Past Surgical History:  Procedure Laterality Date   APPENDECTOMY     TEMPOROMANDIBULAR JOINT SURGERY       Current Meds  Medication Sig   azithromycin  (ZITHROMAX ) 500 MG tablet Take 1 tablet by mouth 3 (three) times a week.   b complex vitamins tablet Take 1 tablet by mouth daily.   Calcium-Vitamin A-Vitamin D (LIQUID CALCIUM PO) Take 5 mLs by mouth daily.   ciprofloxacin (CIPRO) 500 MG tablet Take 500 mg by mouth as needed.   clobetasol (TEMOVATE) 0.05 % external solution Apply 1 Application topically 2 (two) times daily.   COVID-19 mRNA vaccine 2023-2024 (COMIRNATY ) SUSP injection Inject into the muscle.   COVID-19 mRNA vaccine, Moderna, 100 MCG/0.5ML injection Inject into the muscle.   diltiazem  (CARDIZEM  CD) 120 MG 24 hr capsule Take 1 capsule (120 mg total) by mouth daily.   diltiazem  (CARDIZEM ) 30  MG tablet Take 1 tablet (30 mg total) by mouth daily as needed. For palpitations   Grape Seed 100 MG CAPS Take by mouth 2 (two) times daily.   Multiple Vitamins-Minerals (MULTIVITAMIN WITH MINERALS) tablet Take 1 tablet by mouth daily.   rosuvastatin (CRESTOR) 5 MG tablet Take 5 mg by mouth 2 (two) times a week.   STIOLTO RESPIMAT 2.5-2.5 MCG/ACT AERS 2 puffs daily   zolpidem (AMBIEN) 10 MG tablet Take 10 mg by mouth at bedtime as needed for sleep.     Allergies:   Amoxicillin, Molds & smuts, Septra [sulfamethoxazole-trimethoprim], Cefdinir, Clindamycin, Clindamycin hcl, Augmentin [amoxicillin-pot clavulanate], Latex, Levofloxacin, Penicillins, and Tetracycline   Social History   Tobacco Use   Smoking status: Never    Smokeless tobacco: Never  Vaping Use   Vaping status: Never Used  Substance Use Topics   Alcohol use: No    Alcohol/week: 0.0 standard drinks of alcohol   Drug use: No     Family Hx: The patient's family history includes Cancer in her father and maternal grandmother; Diabetes in her paternal grandmother; Hypertension in her brother; Stroke in her mother.  ROS:   Please see the history of present illness.     All other systems reviewed and are negative.   Prior CV studies:   The following studies were reviewed today:  PAP compliance download  Labs/Other Tests and Data Reviewed:     Recent Labs: 07/19/2023: BUN 13; Creatinine, Ser 0.90; Magnesium 2.1; Potassium 4.2; Sodium 142; TSH 4.210   Recent Lipid Panel No results found for: CHOL, TRIG, HDL, CHOLHDL, LDLCALC, LDLDIRECT  Wt Readings from Last 3 Encounters:  06/13/24 109 lb (49.4 kg)  11/18/23 110 lb 6.4 oz (50.1 kg)  10/20/23 107 lb (48.5 kg)     Risk Assessment/Calculations:    Objective:    Vital Signs:  BP 132/62   Pulse 79   Ht 5' 7 (1.702 m)   Wt 109 lb (49.4 kg)   SpO2 98%   BMI 17.07 kg/m   GEN: Well nourished, well developed in no acute distress HEENT: Normal NECK: No JVD; No carotid bruits LYMPHATICS: No lymphadenopathy CARDIAC:RRR, no murmurs, rubs, gallops RESPIRATORY:  Clear to auscultation without rales, wheezing or rhonchi  ABDOMEN: Soft, non-tender, non-distended MUSCULOSKELETAL:  No edema; No deformity  SKIN: Warm and dry NEUROLOGIC:  Alert and oriented x 3 PSYCHIATRIC:  Normal affect  ASSESSMENT & PLAN:    #OSA - The patient is tolerating PAP therapy well without any problems. The PAP download performed by his DME was personally reviewed and interpreted by me today and showed an AHI of 0.2 /hr on 10 cm H2O with 100 % compliance in using more than 4 hours nightly.  The patient has been using and benefiting from PAP use and will continue to benefit from therapy.      #PAT -She had palpitations back a year ago and a Zio patch showed multiple episodes of nonsustained SVT consistent with atrial tachycardia  -palpitations have been very well-controlled on Cardizem  -she only gets the palpitations when she has a cold or is sick and I told her it was fine to use the short acting Cardizem  -Continue Cardizem  CD 120 mg daily with as needed refills  #PVCs - She denies any significant palpitations on Cardizem    #Chronic atypical CP and DOE -likely related to her underlying bronchiectasis>> she denies any anginal chest pain -coronary CTA showed no CAD in 2019 -nuclear stress test 07/2022 with no ischemia (done  for an episode of CP)   #LE edema -2D echo 08/20/2023 showed normal LVF and Diastolic dysfunction and mild RV enlargement with normal PA pressures - Well-controlled on low-sodium diet   #Aortic atherosclerosis -noted in abdominal aorta by CT 09/2020 -Continue statin therapy  #Weakness and Fatigue -likely related to thyroid  and anemia -TSH was normal a year ago at 4.2 -2D echo 08/20/2023 showed EF 65 to 70% with normal LV strain and diastolic function - Followed by her PCP   Medication Adjustments/Labs and Tests Ordered: Current medicines are reviewed at length with the patient today.  Concerns regarding medicines are outlined above.   Tests Ordered: No orders of the defined types were placed in this encounter.    Medication Changes: No orders of the defined types were placed in this encounter.    Follow Up:  In Person in 1 year(s)  Signed, Wilbert Bihari, MD  06/13/2024 12:02 PM    [facility]

## 2024-06-13 NOTE — Patient Instructions (Signed)

## 2024-06-13 NOTE — Addendum Note (Signed)
 Addended by: JANIT GENI CROME on: 06/13/2024 12:13 PM   Modules accepted: Orders

## 2024-06-21 DIAGNOSIS — M1991 Primary osteoarthritis, unspecified site: Secondary | ICD-10-CM | POA: Diagnosis not present

## 2024-06-21 DIAGNOSIS — M797 Fibromyalgia: Secondary | ICD-10-CM | POA: Diagnosis not present

## 2024-06-21 DIAGNOSIS — I7300 Raynaud's syndrome without gangrene: Secondary | ICD-10-CM | POA: Diagnosis not present

## 2024-06-21 DIAGNOSIS — Z681 Body mass index (BMI) 19 or less, adult: Secondary | ICD-10-CM | POA: Diagnosis not present

## 2024-06-21 DIAGNOSIS — M3501 Sicca syndrome with keratoconjunctivitis: Secondary | ICD-10-CM | POA: Diagnosis not present

## 2024-06-28 DIAGNOSIS — J479 Bronchiectasis, uncomplicated: Secondary | ICD-10-CM | POA: Diagnosis not present

## 2024-07-12 DIAGNOSIS — K5904 Chronic idiopathic constipation: Secondary | ICD-10-CM | POA: Diagnosis not present

## 2024-07-12 DIAGNOSIS — R140 Abdominal distension (gaseous): Secondary | ICD-10-CM | POA: Diagnosis not present

## 2024-07-12 DIAGNOSIS — K219 Gastro-esophageal reflux disease without esophagitis: Secondary | ICD-10-CM | POA: Diagnosis not present

## 2024-07-13 ENCOUNTER — Other Ambulatory Visit: Payer: Self-pay

## 2024-07-19 DIAGNOSIS — E039 Hypothyroidism, unspecified: Secondary | ICD-10-CM | POA: Diagnosis not present

## 2024-07-29 DIAGNOSIS — J479 Bronchiectasis, uncomplicated: Secondary | ICD-10-CM | POA: Diagnosis not present

## 2024-08-11 DIAGNOSIS — N959 Unspecified menopausal and perimenopausal disorder: Secondary | ICD-10-CM | POA: Diagnosis not present

## 2024-08-11 DIAGNOSIS — Z79899 Other long term (current) drug therapy: Secondary | ICD-10-CM | POA: Diagnosis not present

## 2024-08-11 DIAGNOSIS — D509 Iron deficiency anemia, unspecified: Secondary | ICD-10-CM | POA: Diagnosis not present

## 2024-08-11 DIAGNOSIS — E0590 Thyrotoxicosis, unspecified without thyrotoxic crisis or storm: Secondary | ICD-10-CM | POA: Diagnosis not present

## 2024-08-11 DIAGNOSIS — N951 Menopausal and female climacteric states: Secondary | ICD-10-CM | POA: Diagnosis not present

## 2024-08-11 DIAGNOSIS — R5383 Other fatigue: Secondary | ICD-10-CM | POA: Diagnosis not present

## 2024-08-11 DIAGNOSIS — Z5181 Encounter for therapeutic drug level monitoring: Secondary | ICD-10-CM | POA: Diagnosis not present

## 2024-08-23 ENCOUNTER — Other Ambulatory Visit (HOSPITAL_BASED_OUTPATIENT_CLINIC_OR_DEPARTMENT_OTHER): Payer: Self-pay

## 2024-08-23 MED ORDER — COMIRNATY 30 MCG/0.3ML IM SUSY
0.3000 mL | PREFILLED_SYRINGE | Freq: Once | INTRAMUSCULAR | 0 refills | Status: AC
  Filled 2024-08-23: qty 0.3, 1d supply, fill #0

## 2024-08-29 DIAGNOSIS — J479 Bronchiectasis, uncomplicated: Secondary | ICD-10-CM | POA: Diagnosis not present

## 2024-09-05 ENCOUNTER — Other Ambulatory Visit (HOSPITAL_BASED_OUTPATIENT_CLINIC_OR_DEPARTMENT_OTHER): Payer: Self-pay

## 2024-09-05 DIAGNOSIS — L218 Other seborrheic dermatitis: Secondary | ICD-10-CM | POA: Diagnosis not present

## 2024-09-05 DIAGNOSIS — I788 Other diseases of capillaries: Secondary | ICD-10-CM | POA: Diagnosis not present

## 2024-09-05 DIAGNOSIS — L309 Dermatitis, unspecified: Secondary | ICD-10-CM | POA: Diagnosis not present

## 2024-09-05 DIAGNOSIS — G4733 Obstructive sleep apnea (adult) (pediatric): Secondary | ICD-10-CM | POA: Diagnosis not present

## 2024-09-05 DIAGNOSIS — J479 Bronchiectasis, uncomplicated: Secondary | ICD-10-CM | POA: Diagnosis not present

## 2024-09-05 MED ORDER — FLUZONE HIGH-DOSE 0.5 ML IM SUSY
0.5000 mL | PREFILLED_SYRINGE | Freq: Once | INTRAMUSCULAR | 0 refills | Status: AC
  Filled 2024-09-05: qty 0.5, 1d supply, fill #0

## 2024-09-14 DIAGNOSIS — I471 Supraventricular tachycardia, unspecified: Secondary | ICD-10-CM | POA: Diagnosis not present

## 2024-09-14 DIAGNOSIS — J449 Chronic obstructive pulmonary disease, unspecified: Secondary | ICD-10-CM | POA: Diagnosis not present

## 2024-09-14 DIAGNOSIS — G4733 Obstructive sleep apnea (adult) (pediatric): Secondary | ICD-10-CM | POA: Diagnosis not present

## 2024-09-14 DIAGNOSIS — E049 Nontoxic goiter, unspecified: Secondary | ICD-10-CM | POA: Diagnosis not present

## 2024-09-14 DIAGNOSIS — E8801 Alpha-1-antitrypsin deficiency: Secondary | ICD-10-CM | POA: Diagnosis not present

## 2024-09-14 DIAGNOSIS — E785 Hyperlipidemia, unspecified: Secondary | ICD-10-CM | POA: Diagnosis not present

## 2024-09-14 DIAGNOSIS — M810 Age-related osteoporosis without current pathological fracture: Secondary | ICD-10-CM | POA: Diagnosis not present

## 2024-09-14 DIAGNOSIS — I700 Atherosclerosis of aorta: Secondary | ICD-10-CM | POA: Diagnosis not present

## 2024-09-20 ENCOUNTER — Other Ambulatory Visit (HOSPITAL_BASED_OUTPATIENT_CLINIC_OR_DEPARTMENT_OTHER): Payer: Self-pay

## 2024-09-20 MED ORDER — AREXVY 120 MCG/0.5ML IM SUSR
0.5000 mL | Freq: Once | INTRAMUSCULAR | 0 refills | Status: AC
  Filled 2024-09-20: qty 0.5, 1d supply, fill #0

## 2024-09-28 DIAGNOSIS — J479 Bronchiectasis, uncomplicated: Secondary | ICD-10-CM | POA: Diagnosis not present

## 2024-10-29 DIAGNOSIS — J479 Bronchiectasis, uncomplicated: Secondary | ICD-10-CM | POA: Diagnosis not present
